# Patient Record
Sex: Male | Born: 1965 | Race: White | Hispanic: No | Marital: Married | Smoking: Never smoker
Health system: Southern US, Community
[De-identification: ages and names within clinical notes are randomized; demographics above are authoritative.]

## PROBLEM LIST (undated history)

## (undated) HISTORY — PX: WISDOM TOOTH EXTRACTION: SHX21

---

## 2010-01-05 ENCOUNTER — Encounter: Admission: RE | Admit: 2010-01-05 | Discharge: 2010-01-05 | Payer: Self-pay | Admitting: Family Medicine

## 2014-07-10 ENCOUNTER — Emergency Department (HOSPITAL_COMMUNITY): Payer: 59

## 2014-07-10 ENCOUNTER — Encounter (HOSPITAL_COMMUNITY): Payer: Self-pay

## 2014-07-10 ENCOUNTER — Emergency Department (HOSPITAL_COMMUNITY)
Admission: EM | Admit: 2014-07-10 | Discharge: 2014-07-10 | Disposition: A | Discharge status: Home / Self Care | Payer: 59 | Attending: Emergency Medicine | Admitting: Emergency Medicine

## 2014-07-10 DIAGNOSIS — R42 Dizziness and giddiness: Secondary | ICD-10-CM

## 2014-07-10 DIAGNOSIS — R197 Diarrhea, unspecified: Secondary | ICD-10-CM | POA: Insufficient documentation

## 2014-07-10 DIAGNOSIS — R112 Nausea with vomiting, unspecified: Secondary | ICD-10-CM

## 2014-07-10 DIAGNOSIS — R630 Anorexia: Secondary | ICD-10-CM | POA: Diagnosis not present

## 2014-07-10 LAB — COMPREHENSIVE METABOLIC PANEL
ALBUMIN: 4.2 g/dL (ref 3.5–5.2)
ALK PHOS: 76 U/L (ref 39–117)
ALT: 35 U/L (ref 0–53)
ANION GAP: 9 (ref 5–15)
AST: 26 U/L (ref 0–37)
BUN: 9 mg/dL (ref 6–23)
CALCIUM: 9.5 mg/dL (ref 8.4–10.5)
CHLORIDE: 106 mmol/L (ref 96–112)
CO2: 25 mmol/L (ref 19–32)
CREATININE: 0.6 mg/dL (ref 0.50–1.35)
GFR calc non Af Amer: >90 mL/min (ref >90)
Glucose, Bld: 130 mg/dL — ABNORMAL HIGH (ref 70–99)
Potassium: 3.8 mmol/L (ref 3.5–5.1)
Sodium: 140 mmol/L (ref 135–145)
TOTAL PROTEIN: 7.2 g/dL (ref 6.0–8.3)
Total Bilirubin: 0.7 mg/dL (ref 0.3–1.2)

## 2014-07-10 LAB — CBC WITH DIFFERENTIAL/PLATELET
Basophils Absolute: 0.1 10*3/uL (ref 0.0–0.1)
Basophils Relative: 1 % (ref 0–1)
EOS ABS: 0.1 10*3/uL (ref 0.0–0.7)
EOS PCT: 1 % (ref 0–5)
HEMATOCRIT: 46.2 % (ref 39.0–52.0)
HEMOGLOBIN: 16.3 g/dL (ref 13.0–17.0)
LYMPHS ABS: 1.9 10*3/uL (ref 0.7–4.0)
LYMPHS PCT: 24 % (ref 12–46)
MCH: 31.7 pg (ref 26.0–34.0)
MCHC: 35.3 g/dL (ref 30.0–36.0)
MCV: 89.7 fL (ref 78.0–100.0)
MONOS PCT: 6 % (ref 3–12)
Monocytes Absolute: 0.5 10*3/uL (ref 0.1–1.0)
Neutro Abs: 5.5 10*3/uL (ref 1.7–7.7)
Neutrophils Relative %: 68 % (ref 43–77)
PLATELETS: 213 10*3/uL (ref 150–400)
RBC: 5.15 MIL/uL (ref 4.22–5.81)
RDW: 12.5 % (ref 11.5–15.5)
WBC: 8 10*3/uL (ref 4.0–10.5)

## 2014-07-10 LAB — URINALYSIS, ROUTINE W REFLEX MICROSCOPIC
BILIRUBIN URINE: NEGATIVE
Glucose, UA: NEGATIVE mg/dL
Hgb urine dipstick: NEGATIVE
KETONES UR: NEGATIVE mg/dL
LEUKOCYTES UA: NEGATIVE
NITRITE: NEGATIVE
PH: 8.5 — AB (ref 5.0–8.0)
PROTEIN: 100 mg/dL — AB
Specific Gravity, Urine: 1.021 (ref 1.005–1.030)
Urobilinogen, UA: 0.2 mg/dL (ref 0.0–1.0)

## 2014-07-10 LAB — URINE MICROSCOPIC-ADD ON

## 2014-07-10 LAB — TROPONIN I
Troponin I: <0.03 ng/mL (ref <0.031)
Troponin I: <0.03 ng/mL (ref <0.031)

## 2014-07-10 LAB — I-STAT CG4 LACTIC ACID, ED
LACTIC ACID, VENOUS: 1.96 mmol/L (ref 0.5–2.0)
Lactic Acid, Venous: 1.07 mmol/L (ref 0.5–2.0)

## 2014-07-10 LAB — LIPASE, BLOOD: Lipase: 22 U/L (ref 11–59)

## 2014-07-10 MED ORDER — ONDANSETRON HCL 4 MG/2ML IJ SOLN
4.0000 mg | Freq: Once | INTRAMUSCULAR | Status: AC
  Administered 2014-07-10: 4 mg via INTRAVENOUS
  Filled 2014-07-10: qty 2

## 2014-07-10 MED ORDER — SODIUM CHLORIDE 0.9 % IV BOLUS (SEPSIS)
1000.0000 mL | Freq: Once | INTRAVENOUS | Status: AC
  Administered 2014-07-10: 1000 mL via INTRAVENOUS

## 2014-07-10 NOTE — ED Notes (Signed)
Pt states he didn't have an appetite at dinner last night.  Pt was at his baseline when he went to bed at 2200.  Pt woke at 0700 with dizziness, blurred vision, N/V/D.

## 2014-07-10 NOTE — ED Provider Notes (Signed)
CSN: [id number]     Arrival date & time 07/10/14  1213 History  This chart was scribed for Glynn Octave, MD by Leone Payor, ED Scribe. This patient was seen in room B18C/B18C and the patient's care was started 1:41 PM.    Chief Complaint  Patient presents with  . Nausea  . Dizziness  . Emesis  . Diarrhea  . Blurred Vision   The history is provided by the patient. No language interpreter was used.    HPI Comments: Evan Pearson is a 49 y.o. male who presents to the Emergency Department complaining of room spinning dizziness and lightheadedness that began this morning after waking. Patient states he feels off balance when attempting to stand and walk. He states the symptoms are worse with movement. He denies similar symptoms in the past. He complains of nausea and vomiting (x5-6 episodes today) with a few episodes of diarrhea. He states he had a loss of appetite last night halfway through dinner last night. He denies recent antibiotic use or recent foreign travel. He denies history of abdominal surgeries. He denies fever, HA, blurry vision, hematochezia, hematemesis, abdominal pain. Patient states he drinks 5-6 beers daily, but states not drinking doesn't elicit tremors.   History reviewed. No pertinent past medical history. History reviewed. No pertinent past surgical history. No family history on file. History  Substance Use Topics  . Smoking status: Never Smoker   . Smokeless tobacco: Not on file  . Alcohol Use: Yes     Comment: 5 beers daily    Review of Systems  A complete 10 system review of systems was obtained and all systems are negative except as noted in the HPI and PMH.    Allergies  Review of patient's allergies indicates no known allergies.  Home Medications   Prior to Admission medications   Not on File   BP 158/86 mmHg  Pulse 78  Temp(Src) 97.5 F (36.4 C) (Oral)  Resp 12  Ht 6' (1.829 m)  Wt 195 lb (88.451 kg)  BMI 26.44 kg/m2  SpO2 97% Physical Exam   Constitutional: He is oriented to person, place, and time. He appears well-developed and well-nourished. No distress.  Pale appearing   HENT:  Head: Normocephalic and atraumatic.  Mouth/Throat: Oropharynx is clear and moist. Mucous membranes are dry. No oropharyngeal exudate.  Eyes: Conjunctivae and EOM are normal. Pupils are equal, round, and reactive to light.  Neck: Normal range of motion. Neck supple.  No meningismus.  Cardiovascular: Normal rate, regular rhythm, normal heart sounds and intact distal pulses.   No murmur heard. Pulmonary/Chest: Effort normal and breath sounds normal. No respiratory distress.  Abdominal: Soft. There is no tenderness. There is no rebound and no guarding.  Genitourinary:  No CVA tenderness  Musculoskeletal: Normal range of motion. He exhibits no edema or tenderness.  Neurological: He is alert and oriented to person, place, and time. No cranial nerve deficit. He exhibits normal muscle tone. Coordination normal.  No ataxia on finger to nose bilaterally. No pronator drift. 5/5 strength throughout. CN 2-12 intact. Negative Romberg. Equal grip strength. Sensation intact. Gait is normal.   Skin: Skin is warm. No rash noted. No pallor.  Psychiatric: He has a normal mood and affect. His behavior is normal.  Nursing note and vitals reviewed.   ED Course  Procedures (including critical care time)  DIAGNOSTIC STUDIES: Oxygen Saturation is 97% on RA, adequate by my interpretation.    COORDINATION OF CARE: 1:48 PM Discussed treatment plan with  pt at bedside and pt agreed to plan.   Labs Review Labs Reviewed  COMPREHENSIVE METABOLIC PANEL - Abnormal; Notable for the following:    Glucose, Bld 130 (*)    All other components within normal limits  URINALYSIS, ROUTINE W REFLEX MICROSCOPIC - Abnormal; Notable for the following:    APPearance CLOUDY (*)    pH 8.5 (*)    Protein, ur 100 (*)    All other components within normal limits  CBC WITH  DIFFERENTIAL/PLATELET  LIPASE, BLOOD  TROPONIN I  URINE MICROSCOPIC-ADD ON  TROPONIN I  I-STAT CG4 LACTIC ACID, ED  I-STAT CG4 LACTIC ACID, ED    Imaging Review Ct Head Wo Contrast  07/10/2014   CLINICAL DATA:  Dizziness and unsteady gait beginning today. No pain or injury.  EXAM: CT HEAD WITHOUT CONTRAST  TECHNIQUE: Contiguous axial images were obtained from the base of the skull through the vertex without intravenous contrast.  COMPARISON:  None.  FINDINGS: There is no intra or extra-axial fluid collection or mass lesion. The basilar cisterns and ventricles have a normal appearance. There is no CT evidence for acute infarction or hemorrhage.  Bone windows show significant sinus disease involving multiple ethmoid air cells. No calvarial fracture.  IMPRESSION: 1.  No evidence for acute intracranial abnormality. 2. Significant sinus disease.   Electronically Signed   By: Norva PavlovElizabeth  Brown M.D.   On: 07/10/2014 16:47     EKG Interpretation   Date/Time:  Friday July 10 2014 14:44:15 EST Ventricular Rate:  71 PR Interval:  142 QRS Duration: 101 QT Interval:  371 QTC Calculation: 403 R Axis:   13 Text Interpretation:  Sinus rhythm RSR' in V1 or V2, probably normal  variant Borderline repolarization abnormality Minimal ST elevation,  anterior leads No previous ECGs available Confirmed by Manus GunningANCOUR  MD,  Donnesha Karg [phone]([id number]) on 07/10/2014 3:04:04 PM     U  MDM   Final diagnoses:  Nausea vomiting and diarrhea  Dizziness   Poor appetite since last night awoke with room spinning dizziness, difficulty walking, nausea, vomiting and diarrhea since last night. Denies abdominal pain or chest pain.  Nonfocal neurological exam. Negative Romberg. Normal gait.  EKG with anterior ST elevation discussed with Dr. Eldridge DaceVaranasi. He feels this does not represent STEMI especially in setting of no chest pain.  Troponin is negative. Labs are reassuring. Patient is tolerating by mouth in the ED. Denies any  chest pain or abdominal pain. He continues to endorse some dizziness with standing but the orthostatics are negative. He is able to ambulate. He'll continue to have by mouth and IV hydration. Second troponin will be obtained given abnormal EKG. CT head will be obtained as well given ongoing dizziness. Care will be transferred to Dr. Radford PaxBeaton at shift change.  I personally performed the services described in this documentation, which was scribed in my presence. The recorded information has been reviewed and is accurate.   Glynn OctaveStephen Verdelle Valtierra, MD 07/10/14 1745

## 2014-07-10 NOTE — Discharge Instructions (Signed)
Diarrhea Diarrhea is watery poop (stool). It can make you feel weak, tired, thirsty, or give you a dry mouth (signs of dehydration). Watery poop is a sign of another problem, most often an infection. It often lasts 2-3 days. It can last longer if it is a sign of something serious. Take care of yourself as told by your doctor. HOME CARE   Drink 1 cup (8 ounces) of fluid each time you have watery poop.  Do not drink the following fluids:  Those that contain simple sugars (fructose, glucose, galactose, lactose, sucrose, maltose).  Sports drinks.  Fruit juices.  Whole milk products.  Sodas.  Drinks with caffeine (coffee, tea, soda) or alcohol.  Oral rehydration solution may be used if the doctor says it is okay. You may make your own solution. Follow this recipe:   - teaspoon table salt.   teaspoon baking soda.   teaspoon salt substitute containing potassium chloride.  1 tablespoons sugar.  1 liter (34 ounces) of water.  Avoid the following foods:  High fiber foods, such as raw fruits and vegetables.  Nuts, seeds, and whole grain breads and cereals.   Those that are sweetened with sugar alcohols (xylitol, sorbitol, mannitol).  Try eating the following foods:  Starchy foods, such as rice, toast, pasta, low-sugar cereal, oatmeal, baked potatoes, crackers, and bagels.  Bananas.  Applesauce.  Eat probiotic-rich foods, such as yogurt and milk products that are fermented.  Wash your hands well after each time you have watery poop.  Only take medicine as told by your doctor.  Take a warm bath to help lessen burning or pain from having watery poop. GET HELP RIGHT AWAY IF:   You cannot drink fluids without throwing up (vomiting).  You keep throwing up.  You have blood in your poop, or your poop looks black and tarry.  You do not pee (urinate) in 6-8 hours, or there is only a small amount of very dark pee.  You have belly (abdominal) pain that gets worse or stays  in the same spot (localizes).  You are weak, dizzy, confused, or light-headed.  You have a very bad headache.  Your watery poop gets worse or does not get better.  You have a fever or lasting symptoms for more than 2-3 days.  You have a fever and your symptoms suddenly get worse. MAKE SURE YOU:   Understand these instructions.  Will watch your condition.  Will get help right away if you are not doing well or get worse. Document Released: 11/01/2007 Document Revised: 09/29/2013 Document Reviewed: 01/21/2012 [facility] Patient Information 2015 [place]. This information is not intended to replace advice given to you by your health care provider. Make sure you discuss any questions you have with your health care provider.  Dizziness  Dizziness means you feel unsteady or lightheaded. You might feel like you are going to pass out (faint). HOME CARE   Drink enough fluids to keep your pee (urine) clear or pale yellow.  Take your medicines exactly as told by your doctor. If you take blood pressure medicine, always stand up slowly from the lying or sitting position. Hold on to something to steady yourself.  If you need to stand in one place for a long time, move your legs often. Tighten and relax your leg muscles.  Have someone stay with you until you feel okay.  Do not drive or use heavy machinery if you feel dizzy.  Do not drink alcohol. GET HELP RIGHT AWAY IF:  You feel dizzy or lightheaded and it gets worse.  You feel sick to your stomach (nauseous), or you throw up (vomit).  You have trouble talking or walking.  You feel weak or have trouble using your arms, hands, or legs.  You cannot think clearly or have trouble forming sentences.  You have chest pain, belly (abdominal) pain, sweating, or you are short of breath.  Your vision changes.  You are bleeding.  You have problems from your medicine that seem to be getting worse. MAKE SURE YOU:   Understand these  instructions.  Will watch your condition.  Will get help right away if you are not doing well or get worse. Document Released: 05/04/2011 Document Revised: 08/07/2011 Document Reviewed: 05/04/2011 [facility] Patient Information 2015 [place], [place]. This information is not intended to replace advice given to you by your health care provider. Make sure you discuss any questions you have with your health care provider.

## 2014-07-10 NOTE — ED Notes (Signed)
Ambulated in the hallway 50 feet. Did very well

## 2014-07-14 ENCOUNTER — Telehealth (HOSPITAL_BASED_OUTPATIENT_CLINIC_OR_DEPARTMENT_OTHER): Payer: Self-pay | Admitting: Emergency Medicine

## 2015-09-30 DIAGNOSIS — H5213 Myopia, bilateral: Secondary | ICD-10-CM | POA: Diagnosis not present

## 2015-10-20 DIAGNOSIS — L237 Allergic contact dermatitis due to plants, except food: Secondary | ICD-10-CM | POA: Diagnosis not present

## 2016-03-20 DIAGNOSIS — Z0000 Encounter for general adult medical examination without abnormal findings: Secondary | ICD-10-CM | POA: Diagnosis not present

## 2016-03-20 DIAGNOSIS — Z23 Encounter for immunization: Secondary | ICD-10-CM | POA: Diagnosis not present

## 2016-03-20 DIAGNOSIS — E781 Pure hyperglyceridemia: Secondary | ICD-10-CM | POA: Diagnosis not present

## 2016-03-21 DIAGNOSIS — S82892A Other fracture of left lower leg, initial encounter for closed fracture: Secondary | ICD-10-CM | POA: Diagnosis not present

## 2016-03-22 DIAGNOSIS — M25572 Pain in left ankle and joints of left foot: Secondary | ICD-10-CM | POA: Diagnosis not present

## 2016-03-22 DIAGNOSIS — S82842A Displaced bimalleolar fracture of left lower leg, initial encounter for closed fracture: Secondary | ICD-10-CM | POA: Diagnosis not present

## 2016-03-29 DIAGNOSIS — S82842D Displaced bimalleolar fracture of left lower leg, subsequent encounter for closed fracture with routine healing: Secondary | ICD-10-CM | POA: Diagnosis not present

## 2016-04-05 DIAGNOSIS — S82842D Displaced bimalleolar fracture of left lower leg, subsequent encounter for closed fracture with routine healing: Secondary | ICD-10-CM | POA: Diagnosis not present

## 2016-04-19 DIAGNOSIS — M25571 Pain in right ankle and joints of right foot: Secondary | ICD-10-CM | POA: Diagnosis not present

## 2016-04-19 DIAGNOSIS — S82842D Displaced bimalleolar fracture of left lower leg, subsequent encounter for closed fracture with routine healing: Secondary | ICD-10-CM | POA: Diagnosis not present

## 2016-05-23 DIAGNOSIS — M25572 Pain in left ankle and joints of left foot: Secondary | ICD-10-CM | POA: Diagnosis not present

## 2016-06-20 DIAGNOSIS — M25572 Pain in left ankle and joints of left foot: Secondary | ICD-10-CM | POA: Diagnosis not present

## 2016-06-22 DIAGNOSIS — S82842D Displaced bimalleolar fracture of left lower leg, subsequent encounter for closed fracture with routine healing: Secondary | ICD-10-CM | POA: Diagnosis not present

## 2016-06-22 DIAGNOSIS — M25572 Pain in left ankle and joints of left foot: Secondary | ICD-10-CM | POA: Diagnosis not present

## 2016-06-29 DIAGNOSIS — S82842D Displaced bimalleolar fracture of left lower leg, subsequent encounter for closed fracture with routine healing: Secondary | ICD-10-CM | POA: Diagnosis not present

## 2016-06-29 DIAGNOSIS — M25572 Pain in left ankle and joints of left foot: Secondary | ICD-10-CM | POA: Diagnosis not present

## 2016-07-06 DIAGNOSIS — M25572 Pain in left ankle and joints of left foot: Secondary | ICD-10-CM | POA: Diagnosis not present

## 2016-07-06 DIAGNOSIS — S82842D Displaced bimalleolar fracture of left lower leg, subsequent encounter for closed fracture with routine healing: Secondary | ICD-10-CM | POA: Diagnosis not present

## 2016-07-13 DIAGNOSIS — S82842D Displaced bimalleolar fracture of left lower leg, subsequent encounter for closed fracture with routine healing: Secondary | ICD-10-CM | POA: Diagnosis not present

## 2016-07-13 DIAGNOSIS — M25572 Pain in left ankle and joints of left foot: Secondary | ICD-10-CM | POA: Diagnosis not present

## 2016-07-18 DIAGNOSIS — M25572 Pain in left ankle and joints of left foot: Secondary | ICD-10-CM | POA: Diagnosis not present

## 2016-07-25 DIAGNOSIS — S82846K Nondisplaced bimalleolar fracture of unspecified lower leg, subsequent encounter for closed fracture with nonunion: Secondary | ICD-10-CM | POA: Diagnosis not present

## 2016-08-24 DIAGNOSIS — M25572 Pain in left ankle and joints of left foot: Secondary | ICD-10-CM | POA: Diagnosis not present

## 2016-09-21 DIAGNOSIS — S82842K Displaced bimalleolar fracture of left lower leg, subsequent encounter for closed fracture with nonunion: Secondary | ICD-10-CM | POA: Diagnosis not present

## 2016-09-26 ENCOUNTER — Other Ambulatory Visit: Payer: Self-pay | Admitting: Family Medicine

## 2016-09-26 DIAGNOSIS — M25572 Pain in left ankle and joints of left foot: Secondary | ICD-10-CM

## 2016-09-26 DIAGNOSIS — S82842K Displaced bimalleolar fracture of left lower leg, subsequent encounter for closed fracture with nonunion: Secondary | ICD-10-CM

## 2016-10-02 ENCOUNTER — Ambulatory Visit
Admission: RE | Admit: 2016-10-02 | Discharge: 2016-10-02 | Disposition: A | Discharge status: Home / Self Care | Payer: BLUE CROSS/BLUE SHIELD | Source: Ambulatory Visit | Attending: Family Medicine | Admitting: Family Medicine

## 2016-10-02 DIAGNOSIS — M25572 Pain in left ankle and joints of left foot: Secondary | ICD-10-CM

## 2016-10-02 DIAGNOSIS — S82842K Displaced bimalleolar fracture of left lower leg, subsequent encounter for closed fracture with nonunion: Secondary | ICD-10-CM

## 2016-10-02 DIAGNOSIS — S82433A Displaced oblique fracture of shaft of unspecified fibula, initial encounter for closed fracture: Secondary | ICD-10-CM | POA: Diagnosis not present

## 2016-10-05 DIAGNOSIS — S82842K Displaced bimalleolar fracture of left lower leg, subsequent encounter for closed fracture with nonunion: Secondary | ICD-10-CM | POA: Diagnosis not present

## 2016-10-11 ENCOUNTER — Encounter (HOSPITAL_BASED_OUTPATIENT_CLINIC_OR_DEPARTMENT_OTHER): Payer: Self-pay | Admitting: *Deleted

## 2016-10-17 NOTE — H&P (Signed)
Evan Pearson is an 51 y.o. male.    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  He is presently being treated with a bone stimulator.  Recent CT scan shows minimal bridging bone but there is some bridging bone.  He wears an Aircast for stabilization.  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  He reports that his discomfort is mild and he walks without assistive devices.  Dr. Althea CharonMcKinley has made him aware of the possibility of bone grafting and application of a plate, but at this time he is not quite ready for surgical intervention.  History reviewed. No pertinent past medical history.  Past Surgical History:  Procedure Laterality Date  . WISDOM TOOTH EXTRACTION      History reviewed. No pertinent family history. Social History:  reports that he has never smoked. He has never used smokeless tobacco. He reports that he drinks alcohol. He reports that he uses drugs, including Marijuana.  Allergies: No Known Allergies  No prescriptions prior to admission.    No results found for this or any previous visit (from the past 48 hour(s)). No results found.  Review of Systems  All other systems reviewed and are negative.   Height 6' (1.829 m), weight 88.5 kg (195 lb). Physical Exam  Constitutional: He is oriented to person, place, and time. He appears well-developed and well-nourished.  HENT:  Head: Normocephalic and atraumatic.  Eyes: Pupils are equal, round, and reactive to light.  Neck: Normal range of motion. Neck supple.  Cardiovascular: Intact distal pulses.   Respiratory: Effort normal.  Musculoskeletal: He exhibits tenderness.  Minimal swelling over the left lateral ankle.  Minimal tenderness over the distal fibula and no palpable motion at the fracture site.  His plain radiographs and CT scan are reviewed with  the patient and there is some bridging bone on the axial, sagittal and coronal views, probably about 5-10% between the 2 major fragments.  Neurological: He is alert and oriented to person, place, and time.  Skin: Skin is warm and dry.  Psychiatric: He has a normal mood and affect. His behavior is normal. Judgment and thought content normal.     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  Plan: We discussed the surgery, which would involve harvesting bone graft from his distal tibia applying it to the delayed union site and then application of a plate over the top of this.  At this time he would like to defer on surgery I am happy to see him back if he changes his mind.  We also discussed the risks and benefits of the surgery, specifically infection, blood clots and anesthesia problems.  Evan Pearson R, PA-C 10/17/2016, 7:58 AM

## 2016-10-18 ENCOUNTER — Ambulatory Visit (HOSPITAL_BASED_OUTPATIENT_CLINIC_OR_DEPARTMENT_OTHER): Payer: BLUE CROSS/BLUE SHIELD | Admitting: Anesthesiology

## 2016-10-18 ENCOUNTER — Encounter (HOSPITAL_BASED_OUTPATIENT_CLINIC_OR_DEPARTMENT_OTHER)
Admission: RE | Disposition: A | Discharge status: Home / Self Care | Payer: Self-pay | Source: Ambulatory Visit | Attending: Orthopedic Surgery

## 2016-10-18 ENCOUNTER — Encounter (HOSPITAL_BASED_OUTPATIENT_CLINIC_OR_DEPARTMENT_OTHER): Payer: Self-pay

## 2016-10-18 ENCOUNTER — Ambulatory Visit (HOSPITAL_BASED_OUTPATIENT_CLINIC_OR_DEPARTMENT_OTHER)
Admission: RE | Admit: 2016-10-18 | Discharge: 2016-10-18 | Disposition: A | Discharge status: Home / Self Care | Payer: BLUE CROSS/BLUE SHIELD | Source: Ambulatory Visit | Attending: Orthopedic Surgery | Admitting: Orthopedic Surgery

## 2016-10-18 DIAGNOSIS — X58XXXD Exposure to other specified factors, subsequent encounter: Secondary | ICD-10-CM | POA: Insufficient documentation

## 2016-10-18 DIAGNOSIS — G8918 Other acute postprocedural pain: Secondary | ICD-10-CM | POA: Diagnosis not present

## 2016-10-18 DIAGNOSIS — S8262XA Displaced fracture of lateral malleolus of left fibula, initial encounter for closed fracture: Secondary | ICD-10-CM | POA: Diagnosis present

## 2016-10-18 DIAGNOSIS — S8262XK Displaced fracture of lateral malleolus of left fibula, subsequent encounter for closed fracture with nonunion: Secondary | ICD-10-CM | POA: Diagnosis not present

## 2016-10-18 HISTORY — PX: ORIF ANKLE FRACTURE: SHX5408

## 2016-10-18 SURGERY — OPEN REDUCTION INTERNAL FIXATION (ORIF) ANKLE FRACTURE
Anesthesia: Regional | Site: Ankle | Laterality: Left

## 2016-10-18 MED ORDER — MIDAZOLAM HCL 2 MG/2ML IJ SOLN
INTRAMUSCULAR | Status: AC
  Filled 2016-10-18: qty 2

## 2016-10-18 MED ORDER — FENTANYL CITRATE (PF) 100 MCG/2ML IJ SOLN
50.0000 ug | INTRAMUSCULAR | Status: DC | PRN
  Administered 2016-10-18: 100 ug via INTRAVENOUS
  Administered 2016-10-18: 50 ug via INTRAVENOUS

## 2016-10-18 MED ORDER — LIDOCAINE 2% (20 MG/ML) 5 ML SYRINGE
INTRAMUSCULAR | Status: DC | PRN
  Administered 2016-10-18: 100 mg via INTRAVENOUS

## 2016-10-18 MED ORDER — OXYCODONE HCL 5 MG PO TABS
5.0000 mg | ORAL_TABLET | Freq: Once | ORAL | Status: DC | PRN
Start: 2016-10-18 — End: 2016-10-18

## 2016-10-18 MED ORDER — PROMETHAZINE HCL 25 MG/ML IJ SOLN: 6.2500 mg | INTRAMUSCULAR | Status: DC | PRN

## 2016-10-18 MED ORDER — ONDANSETRON HCL 4 MG/2ML IJ SOLN: 4.0000 mg | Freq: Four times a day (QID) | INTRAMUSCULAR | Status: DC | PRN

## 2016-10-18 MED ORDER — METOCLOPRAMIDE HCL 5 MG PO TABS
5.0000 mg | ORAL_TABLET | Freq: Three times a day (TID) | ORAL | Status: DC | PRN
Start: 2016-10-18 — End: 2016-10-18

## 2016-10-18 MED ORDER — FENTANYL CITRATE (PF) 100 MCG/2ML IJ SOLN
INTRAMUSCULAR | Status: AC
  Filled 2016-10-18: qty 2

## 2016-10-18 MED ORDER — SCOPOLAMINE 1 MG/3DAYS TD PT72: 1.0000 | MEDICATED_PATCH | Freq: Once | TRANSDERMAL | Status: DC | PRN

## 2016-10-18 MED ORDER — HYDROCODONE-ACETAMINOPHEN 5-325 MG PO TABS: 1.0000 | ORAL_TABLET | ORAL | 0 refills | Status: AC | PRN

## 2016-10-18 MED ORDER — HYDROMORPHONE HCL 1 MG/ML IJ SOLN: 0.2500 mg | INTRAMUSCULAR | Status: DC | PRN

## 2016-10-18 MED ORDER — ONDANSETRON HCL 4 MG PO TABS
4.0000 mg | ORAL_TABLET | Freq: Four times a day (QID) | ORAL | Status: DC | PRN
Start: 2016-10-18 — End: 2016-10-18

## 2016-10-18 MED ORDER — CHLORHEXIDINE GLUCONATE 4 % EX LIQD: 60.0000 mL | Freq: Once | CUTANEOUS | Status: DC

## 2016-10-18 MED ORDER — LACTATED RINGERS IV SOLN
INTRAVENOUS | Status: DC
  Administered 2016-10-18 (×3): via INTRAVENOUS

## 2016-10-18 MED ORDER — ONDANSETRON HCL 4 MG/2ML IJ SOLN
INTRAMUSCULAR | Status: AC
  Filled 2016-10-18: qty 2

## 2016-10-18 MED ORDER — PROPOFOL 10 MG/ML IV BOLUS
INTRAVENOUS | Status: DC | PRN
  Administered 2016-10-18: 200 mg via INTRAVENOUS

## 2016-10-18 MED ORDER — DEXAMETHASONE SODIUM PHOSPHATE 10 MG/ML IJ SOLN
INTRAMUSCULAR | Status: DC | PRN
  Administered 2016-10-18: 10 mg via INTRAVENOUS

## 2016-10-18 MED ORDER — CEFAZOLIN SODIUM-DEXTROSE 2-4 GM/100ML-% IV SOLN
2.0000 g | INTRAVENOUS | Status: AC
  Administered 2016-10-18: 2 g via INTRAVENOUS

## 2016-10-18 MED ORDER — ROPIVACAINE HCL 5 MG/ML IJ SOLN
INTRAMUSCULAR | Status: DC | PRN
  Administered 2016-10-18: 30 mL via PERINEURAL
  Administered 2016-10-18: 20 mL via PERINEURAL

## 2016-10-18 MED ORDER — LIDOCAINE 2% (20 MG/ML) 5 ML SYRINGE
INTRAMUSCULAR | Status: AC
  Filled 2016-10-18: qty 5

## 2016-10-18 MED ORDER — ONDANSETRON HCL 4 MG/2ML IJ SOLN
INTRAMUSCULAR | Status: DC | PRN
  Administered 2016-10-18: 4 mg via INTRAVENOUS

## 2016-10-18 MED ORDER — OXYCODONE HCL 5 MG/5ML PO SOLN: 5.0000 mg | Freq: Once | ORAL | Status: DC | PRN

## 2016-10-18 MED ORDER — DEXAMETHASONE SODIUM PHOSPHATE 10 MG/ML IJ SOLN
INTRAMUSCULAR | Status: AC
  Filled 2016-10-18: qty 1

## 2016-10-18 MED ORDER — METOCLOPRAMIDE HCL 5 MG/ML IJ SOLN: 5.0000 mg | Freq: Three times a day (TID) | INTRAMUSCULAR | Status: DC | PRN

## 2016-10-18 MED ORDER — MIDAZOLAM HCL 2 MG/2ML IJ SOLN
1.0000 mg | INTRAMUSCULAR | Status: DC | PRN
  Administered 2016-10-18: 2 mg via INTRAVENOUS
  Administered 2016-10-18 (×2): 1 mg via INTRAVENOUS

## 2016-10-18 MED ORDER — 0.9 % SODIUM CHLORIDE (POUR BTL) OPTIME
TOPICAL | Status: DC | PRN
  Administered 2016-10-18: 500 mL

## 2016-10-18 MED ORDER — MEPERIDINE HCL 25 MG/ML IJ SOLN: 6.2500 mg | INTRAMUSCULAR | Status: DC | PRN

## 2016-10-18 MED ORDER — DEXTROSE-NACL 5-0.45 % IV SOLN: INTRAVENOUS | Status: DC

## 2016-10-18 MED ORDER — CEFAZOLIN SODIUM-DEXTROSE 2-4 GM/100ML-% IV SOLN
INTRAVENOUS | Status: AC
  Filled 2016-10-18: qty 100

## 2016-10-18 MED ORDER — PROPOFOL 500 MG/50ML IV EMUL
INTRAVENOUS | Status: AC
  Filled 2016-10-18: qty 50

## 2016-10-18 SURGICAL SUPPLY — 78 items
BANDAGE ACE 3X5.8 VEL STRL LF (GAUZE/BANDAGES/DRESSINGS) IMPLANT
BANDAGE ACE 4X5 VEL STRL LF (GAUZE/BANDAGES/DRESSINGS) ×2 IMPLANT
BANDAGE ACE 6X5 VEL STRL LF (GAUZE/BANDAGES/DRESSINGS) ×2 IMPLANT
BANDAGE ESMARK 6X9 LF (GAUZE/BANDAGES/DRESSINGS) ×2 IMPLANT
BIT DRILL 2.5X2.75 QC CALB (BIT) ×2 IMPLANT
BLADE SURG 10 STRL SS (BLADE) ×2 IMPLANT
BLADE SURG 15 STRL LF DISP TIS (BLADE) ×2 IMPLANT
BLADE SURG 15 STRL SS (BLADE) ×2
BNDG ESMARK 6X9 LF (GAUZE/BANDAGES/DRESSINGS) ×4
BOOT STEPPER DURA LG (SOFTGOODS) IMPLANT
BOOT STEPPER DURA MED (SOFTGOODS) IMPLANT
BOOT STEPPER DURA SM (SOFTGOODS) IMPLANT
CANISTER SUCT 1200ML W/VALVE (MISCELLANEOUS) ×2 IMPLANT
COVER BACK TABLE 60X90IN (DRAPES) ×2 IMPLANT
CUFF TOURNIQUET SINGLE 24IN (TOURNIQUET CUFF) IMPLANT
CUFF TOURNIQUET SINGLE 34IN LL (TOURNIQUET CUFF) ×2 IMPLANT
DECANTER SPIKE VIAL GLASS SM (MISCELLANEOUS) IMPLANT
DRAPE EXTREMITY T 121X128X90 (DRAPE) ×2 IMPLANT
DRAPE IMP U-DRAPE 54X76 (DRAPES) ×2 IMPLANT
DRAPE OEC MINIVIEW 54X84 (DRAPES) ×2 IMPLANT
DRAPE SURG 17X23 STRL (DRAPES) IMPLANT
DRAPE U-SHAPE 47X51 STRL (DRAPES) IMPLANT
DURAPREP 26ML APPLICATOR (WOUND CARE) ×2 IMPLANT
ELECT REM PT RETURN 9FT ADLT (ELECTROSURGICAL) ×2
ELECTRODE REM PT RTRN 9FT ADLT (ELECTROSURGICAL) ×1 IMPLANT
GAUZE SPONGE 4X4 12PLY STRL (GAUZE/BANDAGES/DRESSINGS) ×2 IMPLANT
GAUZE SPONGE 4X4 16PLY XRAY LF (GAUZE/BANDAGES/DRESSINGS) IMPLANT
GAUZE XEROFORM 1X8 LF (GAUZE/BANDAGES/DRESSINGS) ×2 IMPLANT
GLOVE BIO SURGEON STRL SZ7.5 (GLOVE) ×2 IMPLANT
GLOVE BIO SURGEON STRL SZ8.5 (GLOVE) ×2 IMPLANT
GLOVE BIOGEL PI IND STRL 8 (GLOVE) ×2 IMPLANT
GLOVE BIOGEL PI IND STRL 9 (GLOVE) ×1 IMPLANT
GLOVE BIOGEL PI INDICATOR 8 (GLOVE) ×2
GLOVE BIOGEL PI INDICATOR 9 (GLOVE) ×1
GOWN STRL REUS W/ TWL LRG LVL3 (GOWN DISPOSABLE) ×2 IMPLANT
GOWN STRL REUS W/TWL LRG LVL3 (GOWN DISPOSABLE) ×2
GOWN STRL REUS W/TWL XL LVL3 (GOWN DISPOSABLE) ×2 IMPLANT
NEEDLE HYPO 22GX1.5 SAFETY (NEEDLE) IMPLANT
NS IRRIG 1000ML POUR BTL (IV SOLUTION) ×2 IMPLANT
PACK BASIN DAY SURGERY FS (CUSTOM PROCEDURE TRAY) ×2 IMPLANT
PAD CAST 3X4 CTTN HI CHSV (CAST SUPPLIES) IMPLANT
PAD CAST 4YDX4 CTTN HI CHSV (CAST SUPPLIES) ×1 IMPLANT
PADDING CAST ABS 4INX4YD NS (CAST SUPPLIES) ×1
PADDING CAST ABS COTTON 4X4 ST (CAST SUPPLIES) ×1 IMPLANT
PADDING CAST COTTON 3X4 STRL (CAST SUPPLIES)
PADDING CAST COTTON 4X4 STRL (CAST SUPPLIES) ×1
PADDING CAST COTTON 6X4 STRL (CAST SUPPLIES) ×2 IMPLANT
PADDING UNDERCAST 2 STRL (CAST SUPPLIES)
PADDING UNDERCAST 2X4 STRL (CAST SUPPLIES) IMPLANT
PENCIL BUTTON HOLSTER BLD 10FT (ELECTRODE) ×2 IMPLANT
PLATE TUB 100DEG 5 HO (Plate) ×2 IMPLANT
SCREW CORTICAL 3.5MM  12MM (Screw) ×2 IMPLANT
SCREW CORTICAL 3.5MM 12MM (Screw) ×2 IMPLANT
SCREW CORTICAL 3.5MM 14MM (Screw) ×4 IMPLANT
SCREW NLOCK CANC HEX 4X14 (Screw) ×2 IMPLANT
SCREW NLOCK CANC HEX 4X16 (Screw) ×2 IMPLANT
SLEEVE SCD COMPRESS KNEE MED (MISCELLANEOUS) IMPLANT
SPLINT FAST PLASTER 5X30 (CAST SUPPLIES)
SPLINT PLASTER CAST FAST 5X30 (CAST SUPPLIES) IMPLANT
SPONGE LAP 18X18 X RAY DECT (DISPOSABLE) IMPLANT
SPONGE LAP 4X18 X RAY DECT (DISPOSABLE) IMPLANT
STAPLER VISISTAT (STAPLE) IMPLANT
STAPLER VISISTAT 35W (STAPLE) IMPLANT
SUCTION FRAZIER HANDLE 10FR (MISCELLANEOUS) ×1
SUCTION TUBE FRAZIER 10FR DISP (MISCELLANEOUS) ×1 IMPLANT
SUT ETHILON 3 0 PS 1 (SUTURE) IMPLANT
SUT ETHILON 4 0 PS 2 18 (SUTURE) IMPLANT
SUT TICRON 1 T 12 (SUTURE) IMPLANT
SUT VIC AB 2-0 SH 27 (SUTURE)
SUT VIC AB 2-0 SH 27XBRD (SUTURE) IMPLANT
SUT VIC AB 3-0 SH 27 (SUTURE) ×1
SUT VIC AB 3-0 SH 27X BRD (SUTURE) ×1 IMPLANT
SYR BULB 3OZ (MISCELLANEOUS) ×2 IMPLANT
SYR CONTROL 10ML LL (SYRINGE) IMPLANT
TOWEL OR 17X24 6PK STRL BLUE (TOWEL DISPOSABLE) ×2 IMPLANT
TUBE CONNECTING 20X1/4 (TUBING) ×2 IMPLANT
UNDERPAD 30X30 (UNDERPADS AND DIAPERS) ×2 IMPLANT
YANKAUER SUCT BULB TIP NO VENT (SUCTIONS) IMPLANT

## 2016-10-18 NOTE — Progress Notes (Signed)
Assisted Dr. Miller with left, ultrasound guided, popliteal, adductor canal block. Side rails up, monitors on throughout procedure. See vital signs in flow sheet. Tolerated Procedure well. 

## 2016-10-18 NOTE — Transfer of Care (Signed)
Immediate Anesthesia Transfer of Care Note  Patient: Evan Pearson  Procedure(s) Performed: Procedure(s): OPEN REDUCTION INTERNAL FIXATION (ORIF) ANKLE: LATERAL ANKLE NON UNION (Left)  Patient Location: PACU  Anesthesia Type:General  Level of Consciousness: awake, alert , oriented and patient cooperative  Airway & Oxygen Therapy: Patient Spontanous Breathing and Patient connected to face mask oxygen  Post-op Assessment: Report given to RN and Post -op Vital signs reviewed and stable  Post vital signs: Reviewed and stable  Last Vitals:  Vitals:   10/18/16 1130 10/18/16 1145  BP: (!) 137/94 (!) 142/86  Pulse: 70 71  Resp: 16 10  Temp:      Last Pain:  Vitals:   10/18/16 1031  TempSrc: Oral         Complications: No apparent anesthesia complications

## 2016-10-18 NOTE — Anesthesia Procedure Notes (Signed)
Anesthesia Regional Block: Adductor canal block   Pre-Anesthetic Checklist: ,, timeout performed, Correct Patient, Correct Site, Correct Laterality, Correct Procedure, Correct Position, site marked, Risks and benefits discussed,  Surgical consent,  Pre-op evaluation,  At surgeon's request and post-op pain management  Laterality: Left  Prep: Dura Prep       Needles:  Injection technique: Single-shot  Needle Type: Stimiplex     Needle Length: 9cm  Needle Gauge: 21     Additional Needles:   Procedures: ultrasound guided,,,,,,,,  Narrative:  Start time: 10/18/2016 11:13 AM End time: 10/18/2016 11:18 AM Injection made incrementally with aspirations every 5 mL.  Performed by: Personally  Anesthesiologist: Anitra LauthMILLER, Starsky Nanna RAY

## 2016-10-18 NOTE — Anesthesia Postprocedure Evaluation (Signed)
Anesthesia Post Note  Patient: Evan Pearson  Procedure(s) Performed: Procedure(s) (LRB): OPEN REDUCTION INTERNAL FIXATION (ORIF) ANKLE: LATERAL ANKLE NON UNION (Left)  Patient location during evaluation: PACU Anesthesia Type: Regional Level of consciousness: awake and alert Pain management: pain level controlled Vital Signs Assessment: post-procedure vital signs reviewed and stable Respiratory status: spontaneous breathing, nonlabored ventilation and respiratory function stable Cardiovascular status: blood pressure returned to baseline and stable Postop Assessment: no signs of nausea or vomiting Anesthetic complications: no       Last Vitals:  Vitals:   10/18/16 1345 10/18/16 1403  BP: (!) 137/93 130/82  Pulse: 71 65  Resp: 20 18  Temp:  36.8 C    Last Pain:  Vitals:   10/18/16 1031  TempSrc: Oral                 Lowella CurbWarren Ray Miller

## 2016-10-18 NOTE — Anesthesia Procedure Notes (Signed)
Performed by: Pearson GrippeOBERTSON, Sissi Padia M

## 2016-10-18 NOTE — Anesthesia Preprocedure Evaluation (Addendum)
Anesthesia Evaluation  Patient identified by MRN, date of birth, ID band Patient awake    Reviewed: Allergy & Precautions, NPO status , Patient's Chart, lab work & pertinent test results  Airway Mallampati: II  TM Distance: >3 FB Neck ROM: Full    Dental no notable dental hx.    Pulmonary neg pulmonary ROS,    Pulmonary exam normal breath sounds clear to auscultation       Cardiovascular negative cardio ROS Normal cardiovascular exam Rhythm:Regular Rate:Normal  Hyperlipidemia   Neuro/Psych negative neurological ROS  negative psych ROS   GI/Hepatic negative GI ROS, Neg liver ROS,   Endo/Other  negative endocrine ROS  Renal/GU negative Renal ROS  negative genitourinary   Musculoskeletal negative musculoskeletal ROS (+)   Abdominal   Peds negative pediatric ROS (+)  Hematology negative hematology ROS (+)   Anesthesia Other Findings   Reproductive/Obstetrics negative OB ROS                            Anesthesia Physical Anesthesia Plan  ASA: II  Anesthesia Plan: General and Regional   Post-op Pain Management: GA combined w/ Regional for post-op pain   Induction: Intravenous  Airway Management Planned: LMA  Additional Equipment:   Intra-op Plan:   Post-operative Plan: Extubation in OR  Informed Consent: I have reviewed the patients History and Physical, chart, labs and discussed the procedure including the risks, benefits and alternatives for the proposed anesthesia with the patient or authorized representative who has indicated his/her understanding and acceptance.   Dental advisory given  Plan Discussed with: CRNA  Anesthesia Plan Comments:         Anesthesia Quick Evaluation

## 2016-10-18 NOTE — Anesthesia Procedure Notes (Signed)
Anesthesia Regional Block: Popliteal block   Pre-Anesthetic Checklist: ,, timeout performed, Correct Patient, Correct Site, Correct Laterality, Correct Procedure, Correct Position, site marked, Risks and benefits discussed,  Surgical consent,  Pre-op evaluation,  At surgeon's request and post-op pain management  Laterality: Left  Prep: Dura Prep       Needles:  Injection technique: Single-shot  Needle Type: Stimiplex     Needle Length: 9cm  Needle Gauge: 21     Additional Needles:   Procedures: ultrasound guided,,,,,,,,  Narrative:  Start time: 10/18/2016 11:13 AM End time: 10/18/2016 11:18 AM Injection made incrementally with aspirations every 5 mL.  Performed by: Personally  Anesthesiologist: Anitra LauthMILLER, Darnesha Diloreto RAY

## 2016-10-18 NOTE — Discharge Instructions (Signed)

## 2016-10-18 NOTE — Anesthesia Procedure Notes (Signed)
Procedure Name: LMA Insertion Date/Time: 10/18/2016 12:18 PM Performed by: Pearson GrippeOBERTSON, Chong Wojdyla M Pre-anesthesia Checklist: Patient identified, Timeout performed, Emergency Drugs available, Suction available and Patient being monitored Patient Re-evaluated:Patient Re-evaluated prior to inductionOxygen Delivery Method: Circle system utilized Preoxygenation: Pre-oxygenation with 100% oxygen Intubation Type: IV induction Ventilation: Mask ventilation without difficulty LMA: LMA inserted LMA Size: 4.0 Number of attempts: 1 Placement Confirmation: positive ETCO2 and breath sounds checked- equal and bilateral Tube secured with: Tape

## 2016-10-18 NOTE — H&P (View-Only) (Signed)
Assisted Dr. Miller with left, ultrasound guided, popliteal, adductor canal block. Side rails up, monitors on throughout procedure. See vital signs in flow sheet. Tolerated Procedure well. 

## 2016-10-18 NOTE — Op Note (Addendum)
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 Turner Danielsowan M.D.  First assistant: Tomi LikensEric K. Gaylene BrooksPhillips PA-C  (present throughout entire procedure and necessary for timely completion of the procedure) Anesthetic: Gen. LMA  Estimated blood loss: Minimal  Tourniquet time: 30 minutes   Indications for procedure: Patient sustained a minimally displaced left ankle lateral malleolus fracture 8 months ago. Treated with a Cam Walker boot, failed to unite after 3 months like her magnet was used for the next 3 months and he still had nonunion proven by CT scan. Patient continued pain over the lateral aspect of the left ankle. Because of the nonunion with discomfort we discussed takedown of the nonunion and then application of local bone graft followed by fixation with a either one third tubular plate or an anatomic lateral locking plate. Risks and benefits of surgery discussed at length, all questions encouraged and answered.  Description of procedure: The patient was identified by arm band receive preoperative IV antibiotics in the holding area at, day surgery Center. He then received a right popliteal block, was taken to the operating room where the appropriate anesthetic monitors were attached and general LMA anesthesia was induced. A tourniquet was applied high to the Left calf and the right lower from a prepped and draped in the usual sterile fashion from the toes to the tourniquet. A time out procedure was performed. The limb was then wrapped with an Esmarch bandage and the tourniquet inflated to 300 mm of mercury. We began the operation by making a longitudinal incision starting at the tip of lateral malleolus and going proximally for 7 cm. Small bleeders in the subcutaneous tissue identified and cauterized and using  tenotomy scissors we dissected down to the fracture site and reflected the periosteum anteriorly and posteriorly exposing the nonunion site which was about 2 mm wide going from anterior distal to proximal posterior. Using very small Aundria RudRogers we then cleared out the nonunion site and noted very little motion at the nonunion site possibly there was some medial bridging. We then set about taking a localized bone graft from the hypertrophic portion of the nonunion and packed it in the nonunion site itself. There was more than enough local grafting to accomplish this. We then selected a 5 hole one third tubular plate bent the tip and applied it over the distal fibula contoured to the bone. We placed 3 bicortical screws proximally and one cancellus and 1 unicortical screw distally. C-arm images were then obtained confirming good position of the plate and the bone graft. The tourniquet was let down small bleeders identified and cauterized, we then closed in layers with 3-0 Vicryl suture subcutaneously and subcuticular. A dressing of 0 from 4 x 4 dressing sponges web roll Ace wrap and a Cam Walker boot was then applied. The patient was then awakened extubated and taken to the recovery without difficulty. He will be weightbearing as tolerated with a postop shoe.

## 2016-10-18 NOTE — Interval H&P Note (Signed)
History and Physical Interval Note:  10/18/2016 12:01 PM  Evan Pearson  has presented today for surgery, with the diagnosis of LEFT ANKLE LATERAL MALLEOUS NON UNION  The various methods of treatment have been discussed with the patient and family. After consideration of risks, benefits and other options for treatment, the patient has consented to  Procedure(s): OPEN REDUCTION INTERNAL FIXATION (ORIF) ANKLE: LATERAL ANKLE NON UNION (Left) as a surgical intervention .  The patient's history has been reviewed, patient examined, no change in status, stable for surgery.  I have reviewed the patient's chart and labs.  Questions were answered to the patient's satisfaction.     Nestor LewandowskyOWAN,Kahron Kauth J

## 2016-10-19 ENCOUNTER — Encounter (HOSPITAL_BASED_OUTPATIENT_CLINIC_OR_DEPARTMENT_OTHER): Payer: Self-pay | Admitting: Orthopedic Surgery

## 2016-10-25 DIAGNOSIS — M25572 Pain in left ankle and joints of left foot: Secondary | ICD-10-CM | POA: Diagnosis not present

## 2016-12-07 DIAGNOSIS — M25572 Pain in left ankle and joints of left foot: Secondary | ICD-10-CM | POA: Diagnosis not present

## 2017-01-23 DIAGNOSIS — M25572 Pain in left ankle and joints of left foot: Secondary | ICD-10-CM | POA: Diagnosis not present

## 2017-03-07 DIAGNOSIS — Z23 Encounter for immunization: Secondary | ICD-10-CM | POA: Diagnosis not present

## 2017-03-27 DIAGNOSIS — Z0000 Encounter for general adult medical examination without abnormal findings: Secondary | ICD-10-CM | POA: Diagnosis not present

## 2017-03-27 DIAGNOSIS — Z125 Encounter for screening for malignant neoplasm of prostate: Secondary | ICD-10-CM | POA: Diagnosis not present

## 2017-03-27 DIAGNOSIS — E781 Pure hyperglyceridemia: Secondary | ICD-10-CM | POA: Diagnosis not present

## 2017-05-03 DIAGNOSIS — Z1211 Encounter for screening for malignant neoplasm of colon: Secondary | ICD-10-CM | POA: Diagnosis not present

## 2017-05-03 DIAGNOSIS — K635 Polyp of colon: Secondary | ICD-10-CM | POA: Diagnosis not present

## 2017-05-03 DIAGNOSIS — D126 Benign neoplasm of colon, unspecified: Secondary | ICD-10-CM | POA: Diagnosis not present

## 2017-05-03 DIAGNOSIS — K644 Residual hemorrhoidal skin tags: Secondary | ICD-10-CM | POA: Diagnosis not present

## 2017-05-09 DIAGNOSIS — K635 Polyp of colon: Secondary | ICD-10-CM | POA: Diagnosis not present

## 2017-05-09 DIAGNOSIS — D126 Benign neoplasm of colon, unspecified: Secondary | ICD-10-CM | POA: Diagnosis not present

## 2017-05-09 DIAGNOSIS — Z1211 Encounter for screening for malignant neoplasm of colon: Secondary | ICD-10-CM | POA: Diagnosis not present

## 2017-11-20 DIAGNOSIS — M79672 Pain in left foot: Secondary | ICD-10-CM | POA: Diagnosis not present

## 2017-11-20 DIAGNOSIS — M25572 Pain in left ankle and joints of left foot: Secondary | ICD-10-CM | POA: Diagnosis not present

## 2017-11-20 DIAGNOSIS — M79675 Pain in left toe(s): Secondary | ICD-10-CM | POA: Diagnosis not present

## 2018-01-10 DIAGNOSIS — F40243 Fear of flying: Secondary | ICD-10-CM | POA: Diagnosis not present

## 2018-01-10 DIAGNOSIS — Z23 Encounter for immunization: Secondary | ICD-10-CM | POA: Diagnosis not present

## 2018-03-05 DIAGNOSIS — H5213 Myopia, bilateral: Secondary | ICD-10-CM | POA: Diagnosis not present

## 2018-04-05 DIAGNOSIS — Z23 Encounter for immunization: Secondary | ICD-10-CM | POA: Diagnosis not present

## 2018-04-05 DIAGNOSIS — E781 Pure hyperglyceridemia: Secondary | ICD-10-CM | POA: Diagnosis not present

## 2018-04-05 DIAGNOSIS — Z0001 Encounter for general adult medical examination with abnormal findings: Secondary | ICD-10-CM | POA: Diagnosis not present

## 2018-04-05 DIAGNOSIS — Z125 Encounter for screening for malignant neoplasm of prostate: Secondary | ICD-10-CM | POA: Diagnosis not present

## 2018-04-05 DIAGNOSIS — F1010 Alcohol abuse, uncomplicated: Secondary | ICD-10-CM | POA: Diagnosis not present

## 2018-05-08 DIAGNOSIS — M25561 Pain in right knee: Secondary | ICD-10-CM | POA: Diagnosis not present

## 2018-07-16 DIAGNOSIS — Z23 Encounter for immunization: Secondary | ICD-10-CM | POA: Diagnosis not present

## 2018-08-08 ENCOUNTER — Ambulatory Visit
Admission: RE | Admit: 2018-08-08 | Discharge: 2018-08-08 | Disposition: A | Discharge status: Home / Self Care | Payer: BLUE CROSS/BLUE SHIELD | Source: Ambulatory Visit | Attending: Gastroenterology | Admitting: Gastroenterology

## 2018-08-08 ENCOUNTER — Other Ambulatory Visit: Payer: Self-pay | Admitting: Gastroenterology

## 2018-08-08 DIAGNOSIS — Z0389 Encounter for observation for other suspected diseases and conditions ruled out: Secondary | ICD-10-CM | POA: Diagnosis not present

## 2018-08-08 DIAGNOSIS — Z86010 Personal history of colonic polyps: Secondary | ICD-10-CM | POA: Diagnosis not present

## 2018-08-08 DIAGNOSIS — D123 Benign neoplasm of transverse colon: Secondary | ICD-10-CM | POA: Diagnosis not present

## 2018-08-08 DIAGNOSIS — T17908A Unspecified foreign body in respiratory tract, part unspecified causing other injury, initial encounter: Secondary | ICD-10-CM

## 2018-08-08 DIAGNOSIS — D122 Benign neoplasm of ascending colon: Secondary | ICD-10-CM | POA: Diagnosis not present

## 2018-08-13 DIAGNOSIS — D122 Benign neoplasm of ascending colon: Secondary | ICD-10-CM | POA: Diagnosis not present

## 2018-08-13 DIAGNOSIS — D123 Benign neoplasm of transverse colon: Secondary | ICD-10-CM | POA: Diagnosis not present

## 2018-08-18 IMAGING — CT CT ANKLE*L* W/O CM
1 series · 12 of 14 positions shown, 15 images · non-contrast
Comparison: None.

CLINICAL DATA: History of a left ankle fracture 7 months ago.
Initial encounter.

EXAM:
CT OF THE LEFT ANKLE WITHOUT CONTRAST
TECHNIQUE: Multidetector CT imaging of the left ankle was performed according
to the standard protocol. Multiplanar CT image reconstructions were
also generated.

[Series 4: soft tissue lower extremity · axial · 0.32mm/px · z∈[-1033,-879]mm · 12 of 92 slices shown, 15 images]
[im 8/92  soft-tissue]
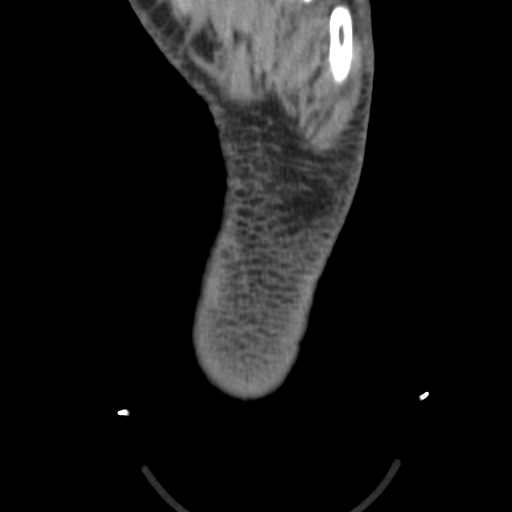
[im 8/92  bone]
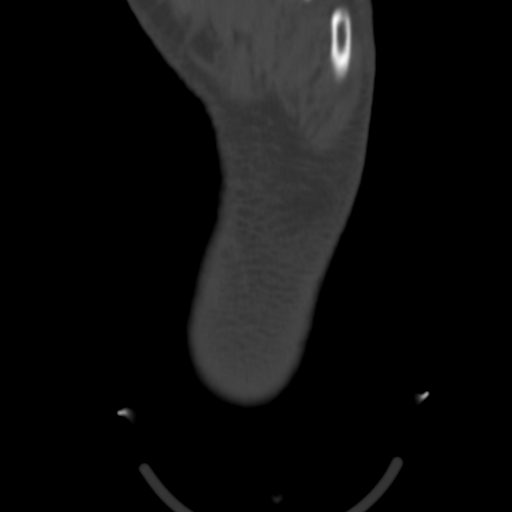
[im 15/92  bone]
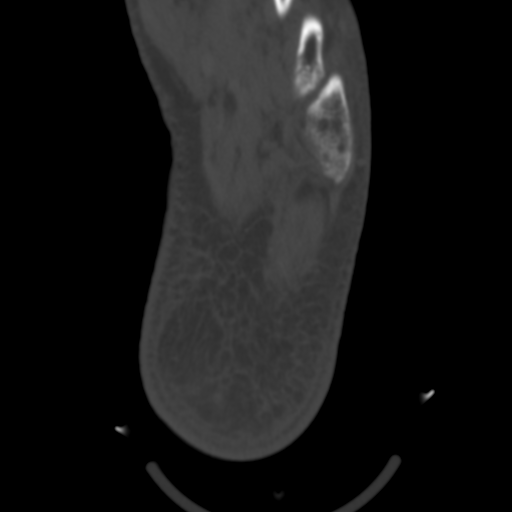
[im 22/92  bone]
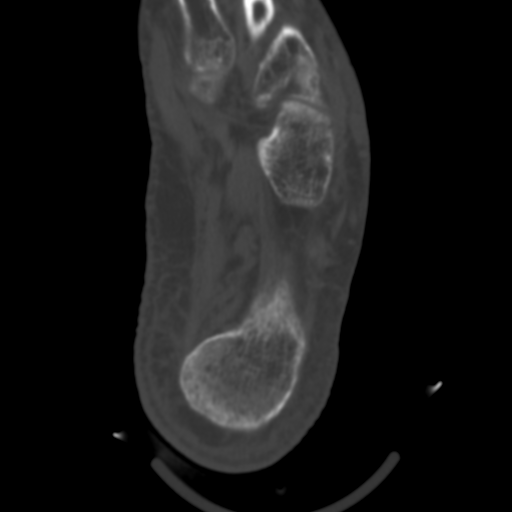
[im 29/92  bone]
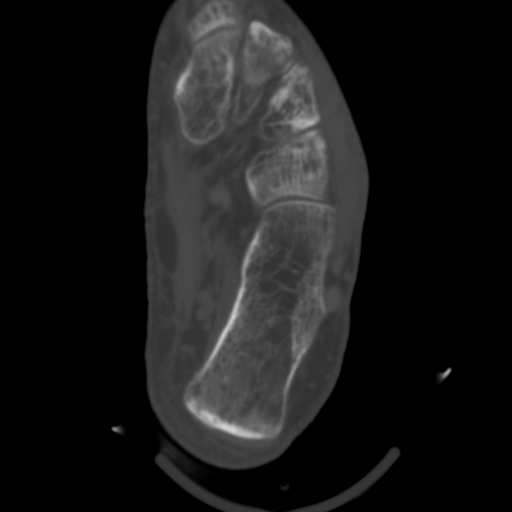
[im 36/92  soft-tissue]
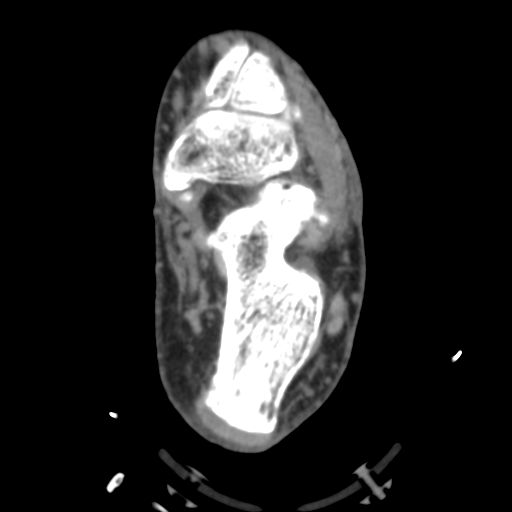
[im 36/92  bone]
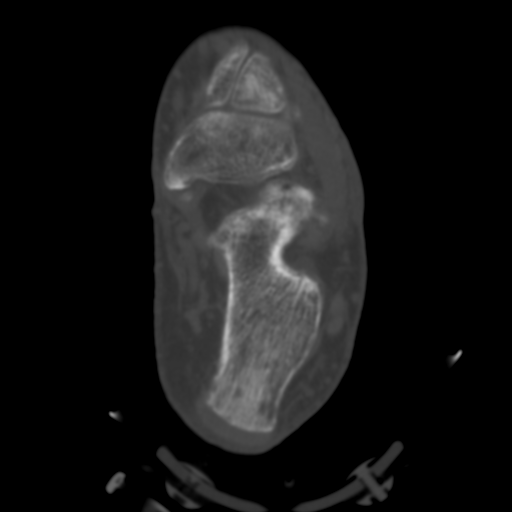
[im 43/92  bone]
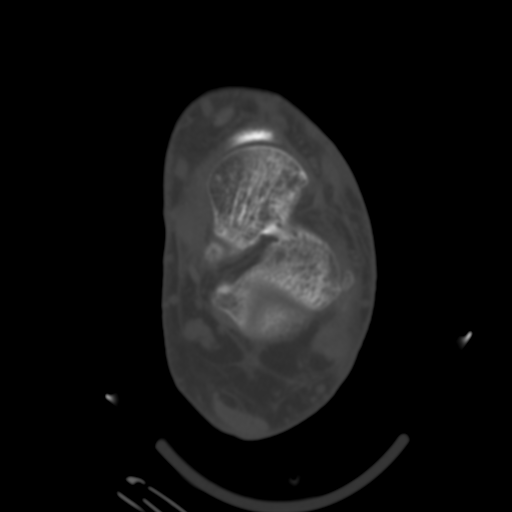
[im 50/92  bone]
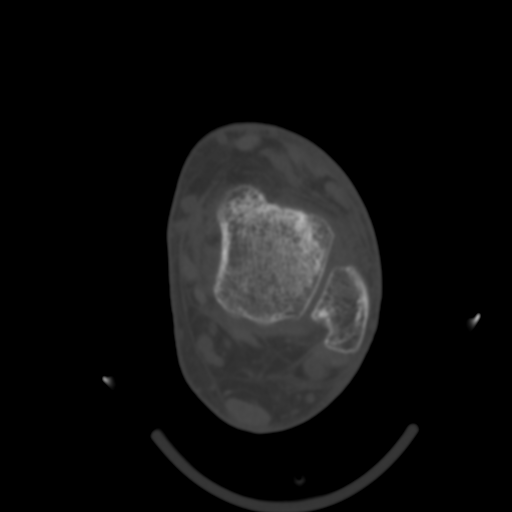
[im 57/92  bone]
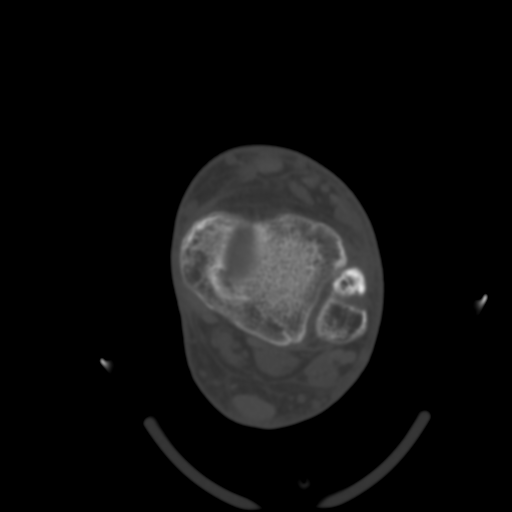
[im 64/92  soft-tissue]
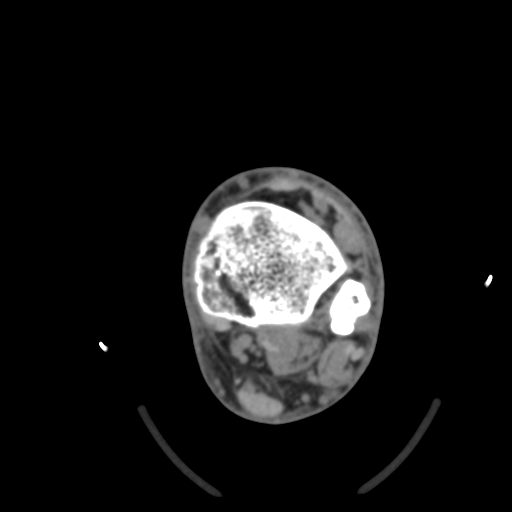
[im 64/92  bone]
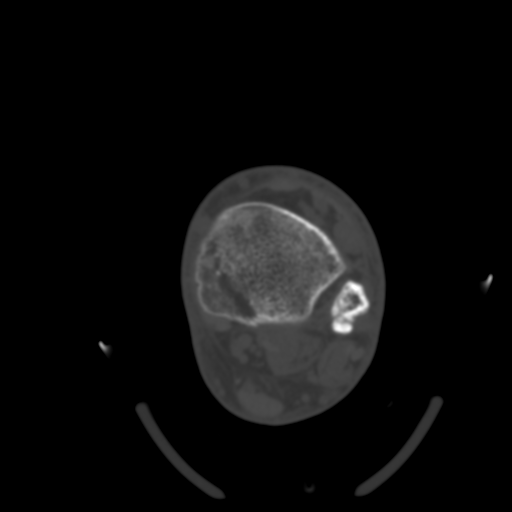
[im 71/92  bone]
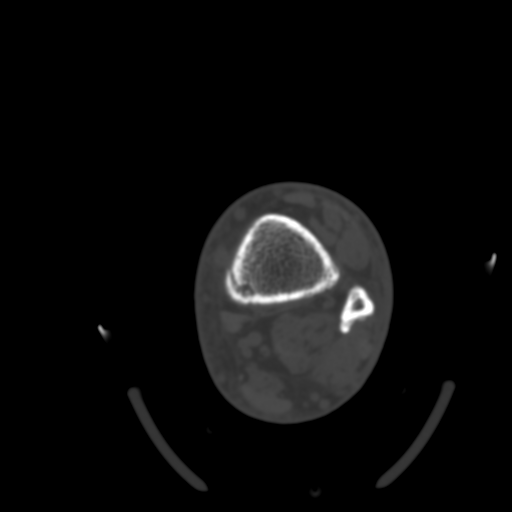
[im 78/92  bone]
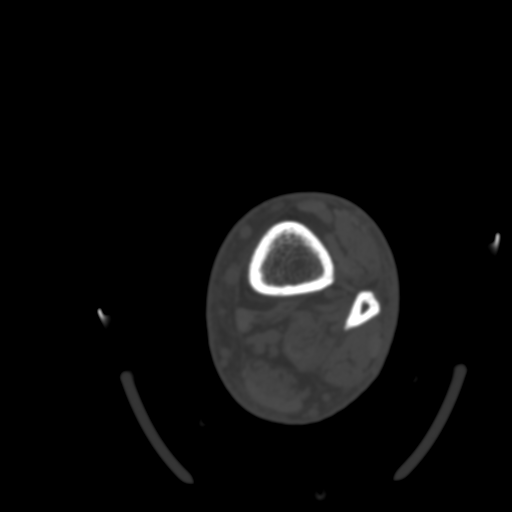
[im 85/92  bone]
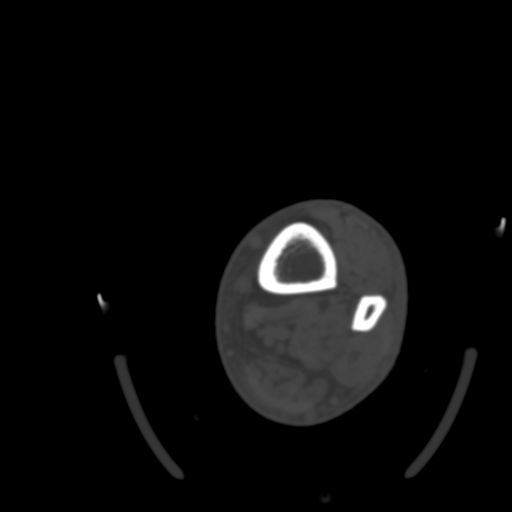

[12 of 14 positions shown; findings below may reference images not displayed]

FINDINGS: Bones/Joint/Cartilage

The patient has an oblique fracture of the distal fibula. The
fracture fragments are mildly distracted and the fracture line
extends from the posterior cortex approximately 6.5 cm above the tip
of the lateral malleolus in an anterior and inferior orientation the
exiting the the distal fibula 2 cm above the tip of the lateral
malleolus. The fracture is largely a nonunion with corticated
margins about the fracture lines. Only small foci of bridging bone
are seen at the superior and inferior margins of the fracture.

The patient also has a nondisplaced fracture of the medial malleolus
extending into the posterior malleolus. This fracture appears
largely healed with partial visualization of fracture lines in the
medullary space.

No other fracture is identified. Bones are osteopenic. No notable
arthropathy. Small accessory ossicle of the navicular is identified.
There is also a tiny well corticated bone fragment off the lateral
process of the talus which may be due to remote trauma.

Ligaments

Suboptimally assessed by CT scan.  Appear intact.

Muscles and Tendons

Intact unremarkable.

Soft tissues

Appear normal.
IMPRESSION: Oblique and slightly distracted fracture of the distal fibula is
largely nonunion with only small areas of bridging bone seen at the
superior most and inferior most margins of the fracture.

Nondisplaced medial to posterior malleolar fracture is largely
healed. Only a small segment of fracture line remains visible within
the medullary space.

## 2019-01-03 DIAGNOSIS — Z20828 Contact with and (suspected) exposure to other viral communicable diseases: Secondary | ICD-10-CM | POA: Diagnosis not present

## 2019-01-03 DIAGNOSIS — Z03818 Encounter for observation for suspected exposure to other biological agents ruled out: Secondary | ICD-10-CM | POA: Diagnosis not present

## 2019-01-03 DIAGNOSIS — Z7189 Other specified counseling: Secondary | ICD-10-CM | POA: Diagnosis not present

## 2019-02-24 DIAGNOSIS — Z23 Encounter for immunization: Secondary | ICD-10-CM | POA: Diagnosis not present

## 2019-03-04 DIAGNOSIS — Z20828 Contact with and (suspected) exposure to other viral communicable diseases: Secondary | ICD-10-CM | POA: Diagnosis not present

## 2019-04-21 DIAGNOSIS — Z125 Encounter for screening for malignant neoplasm of prostate: Secondary | ICD-10-CM | POA: Diagnosis not present

## 2019-04-21 DIAGNOSIS — E781 Pure hyperglyceridemia: Secondary | ICD-10-CM | POA: Diagnosis not present

## 2019-04-22 DIAGNOSIS — Z0000 Encounter for general adult medical examination without abnormal findings: Secondary | ICD-10-CM | POA: Diagnosis not present

## 2019-07-09 DIAGNOSIS — Z7189 Other specified counseling: Secondary | ICD-10-CM | POA: Diagnosis not present

## 2019-07-09 DIAGNOSIS — Z20828 Contact with and (suspected) exposure to other viral communicable diseases: Secondary | ICD-10-CM | POA: Diagnosis not present

## 2019-07-24 ENCOUNTER — Ambulatory Visit: Payer: Self-pay | Attending: Internal Medicine

## 2019-07-24 DIAGNOSIS — Z23 Encounter for immunization: Secondary | ICD-10-CM | POA: Insufficient documentation

## 2019-07-24 NOTE — Progress Notes (Signed)
   Covid-19 Vaccination Clinic  Name:  Evan Pearson    MRN: [id number] DOB: 15-Dec-1965  07/24/2019  [name] was observed post Covid-19 immunization for 15 minutes without incidence. He was provided with Vaccine Information Sheet and instruction to access the V-Safe system.   [name] was instructed to call 911 with any severe reactions post vaccine: Marland Kitchen Difficulty breathing  . Swelling of your face and throat  . A fast heartbeat  . A bad rash all over your body  . Dizziness and weakness    Immunizations Administered    Name Date Dose VIS Date Route   Pfizer COVID-19 Vaccine 07/24/2019 11:42 AM 0.3 mL 05/09/2019 Intramuscular   Manufacturer: [employer], [employer]   Lot: [id number]   NDC: [address]-2

## 2019-07-29 ENCOUNTER — Ambulatory Visit: Payer: Self-pay

## 2019-07-29 NOTE — Telephone Encounter (Signed)
Pt previously scheduled on day 20 after 1st Pfizer vaccination. Changed appt to Saturday 08/16/19. Pt given new vaccination appt and verbalized understanding. Reason for Disposition . General information question, no triage required and triager able to answer question  Answer Assessment - Initial Assessment Questions 1. MAIN CONCERN OR SYMPTOM:  "What is your main concern right now?" "What question do you have?" "What's the main symptom you're worried about?" (e.g., fever, pain, redness, swelling)     Need to change 2nd vaccine appt.  2. VACCINE: "What vaccination did you receive?" "Is this your first or second shot?" (e.g., none; Moderna, [employer], other)  Pfizer  Answer Assessment - Initial Assessment Questions 1. REASON FOR CALL or QUESTION: "What is your reason for calling today?" or "How can I best help you?" or "What question do you have that I can help answer?"     Need t reschedule vaccine appt that was scheduled on Day 20.  Protocols used: INFORMATION ONLY CALL - NO TRIAGE-A-AH, CORONAVIRUS (COVID-19) VACCINE QUESTIONS AND REACTIONS-A-AH

## 2019-08-13 ENCOUNTER — Ambulatory Visit: Payer: Self-pay

## 2019-08-16 ENCOUNTER — Ambulatory Visit: Payer: Self-pay | Attending: Internal Medicine

## 2019-08-16 DIAGNOSIS — Z23 Encounter for immunization: Secondary | ICD-10-CM

## 2019-08-16 NOTE — Progress Notes (Signed)
   Covid-19 Vaccination Clinic  Name:  Evan Pearson    MRN: [id number] DOB: Nov 20, 1965  08/16/2019  Evan Pearson was observed post Covid-19 immunization for 15 minutes without incident. He was provided with Vaccine Information Sheet and instruction to access the V-Safe system.   Evan Pearson was instructed to call 911 with any severe reactions post vaccine: Marland Kitchen Difficulty breathing  . Swelling of face and throat  . A fast heartbeat  . A bad rash all over body  . Dizziness and weakness   Immunizations Administered    Name Date Dose VIS Date Route   Pfizer COVID-19 Vaccine 08/16/2019 10:07 AM 0.3 mL 05/09/2019 Intramuscular   Manufacturer: [employer], [employer]   Lot: [id number]   NDC: [address]-6

## 2019-09-02 DIAGNOSIS — Z03818 Encounter for observation for suspected exposure to other biological agents ruled out: Secondary | ICD-10-CM | POA: Diagnosis not present

## 2019-09-23 DIAGNOSIS — Z03818 Encounter for observation for suspected exposure to other biological agents ruled out: Secondary | ICD-10-CM | POA: Diagnosis not present

## 2019-09-25 DIAGNOSIS — K1379 Other lesions of oral mucosa: Secondary | ICD-10-CM | POA: Diagnosis not present

## 2019-10-24 DIAGNOSIS — Z1159 Encounter for screening for other viral diseases: Secondary | ICD-10-CM | POA: Diagnosis not present

## 2019-10-29 DIAGNOSIS — Z86010 Personal history of colonic polyps: Secondary | ICD-10-CM | POA: Diagnosis not present

## 2019-10-29 DIAGNOSIS — D123 Benign neoplasm of transverse colon: Secondary | ICD-10-CM | POA: Diagnosis not present

## 2019-10-29 DIAGNOSIS — K635 Polyp of colon: Secondary | ICD-10-CM | POA: Diagnosis not present

## 2019-10-29 DIAGNOSIS — K621 Rectal polyp: Secondary | ICD-10-CM | POA: Diagnosis not present

## 2019-11-17 DIAGNOSIS — J343 Hypertrophy of nasal turbinates: Secondary | ICD-10-CM | POA: Diagnosis not present

## 2019-11-17 DIAGNOSIS — L729 Follicular cyst of the skin and subcutaneous tissue, unspecified: Secondary | ICD-10-CM | POA: Diagnosis not present

## 2019-12-04 DIAGNOSIS — L720 Epidermal cyst: Secondary | ICD-10-CM | POA: Diagnosis not present

## 2020-01-09 DIAGNOSIS — Z03818 Encounter for observation for suspected exposure to other biological agents ruled out: Secondary | ICD-10-CM | POA: Diagnosis not present

## 2020-01-11 DIAGNOSIS — Z03818 Encounter for observation for suspected exposure to other biological agents ruled out: Secondary | ICD-10-CM | POA: Diagnosis not present

## 2020-02-10 DIAGNOSIS — Z03818 Encounter for observation for suspected exposure to other biological agents ruled out: Secondary | ICD-10-CM | POA: Diagnosis not present

## 2020-03-01 DIAGNOSIS — Z03818 Encounter for observation for suspected exposure to other biological agents ruled out: Secondary | ICD-10-CM | POA: Diagnosis not present

## 2020-03-09 DIAGNOSIS — M79671 Pain in right foot: Secondary | ICD-10-CM | POA: Diagnosis not present

## 2020-03-26 DIAGNOSIS — Z03818 Encounter for observation for suspected exposure to other biological agents ruled out: Secondary | ICD-10-CM | POA: Diagnosis not present

## 2020-03-31 DIAGNOSIS — Z23 Encounter for immunization: Secondary | ICD-10-CM | POA: Diagnosis not present

## 2020-04-19 DIAGNOSIS — Z125 Encounter for screening for malignant neoplasm of prostate: Secondary | ICD-10-CM | POA: Diagnosis not present

## 2020-04-19 DIAGNOSIS — Z0000 Encounter for general adult medical examination without abnormal findings: Secondary | ICD-10-CM | POA: Diagnosis not present

## 2020-04-19 DIAGNOSIS — E781 Pure hyperglyceridemia: Secondary | ICD-10-CM | POA: Diagnosis not present

## 2020-04-26 DIAGNOSIS — Z0001 Encounter for general adult medical examination with abnormal findings: Secondary | ICD-10-CM | POA: Diagnosis not present

## 2020-04-28 DIAGNOSIS — Z03818 Encounter for observation for suspected exposure to other biological agents ruled out: Secondary | ICD-10-CM | POA: Diagnosis not present

## 2020-05-31 DIAGNOSIS — Z03818 Encounter for observation for suspected exposure to other biological agents ruled out: Secondary | ICD-10-CM | POA: Diagnosis not present

## 2020-06-07 DIAGNOSIS — Z03818 Encounter for observation for suspected exposure to other biological agents ruled out: Secondary | ICD-10-CM | POA: Diagnosis not present

## 2020-06-23 IMAGING — DX CHEST - 2 VIEW
2 series · 2 of 2 positions shown · non-contrast
Comparison: 04/07/2011

CLINICAL DATA: Possible aspiration

EXAM:
CHEST - 2 VIEW

[dg chest 2 view (1 of 2)]
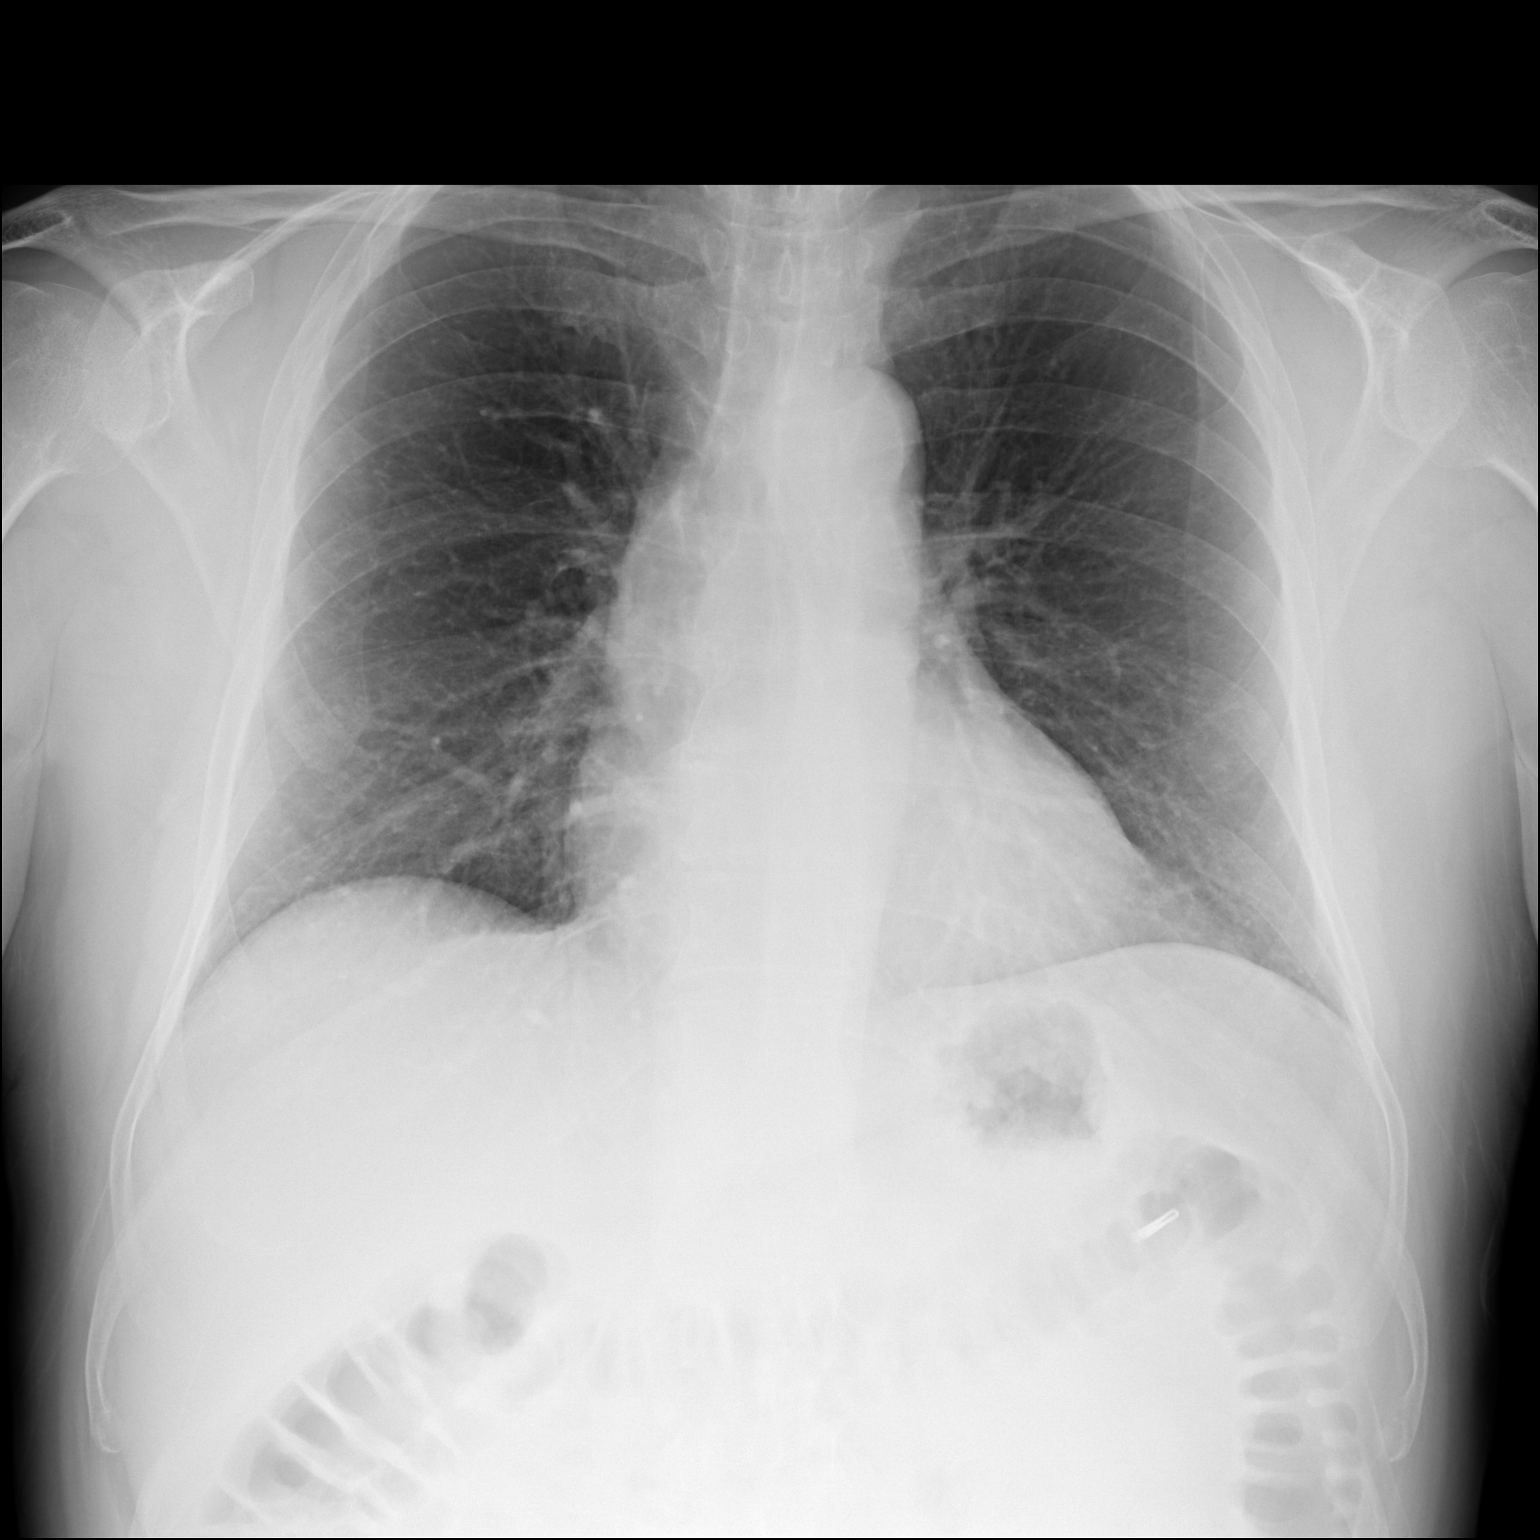

[dg chest 2 view (2 of 2)]
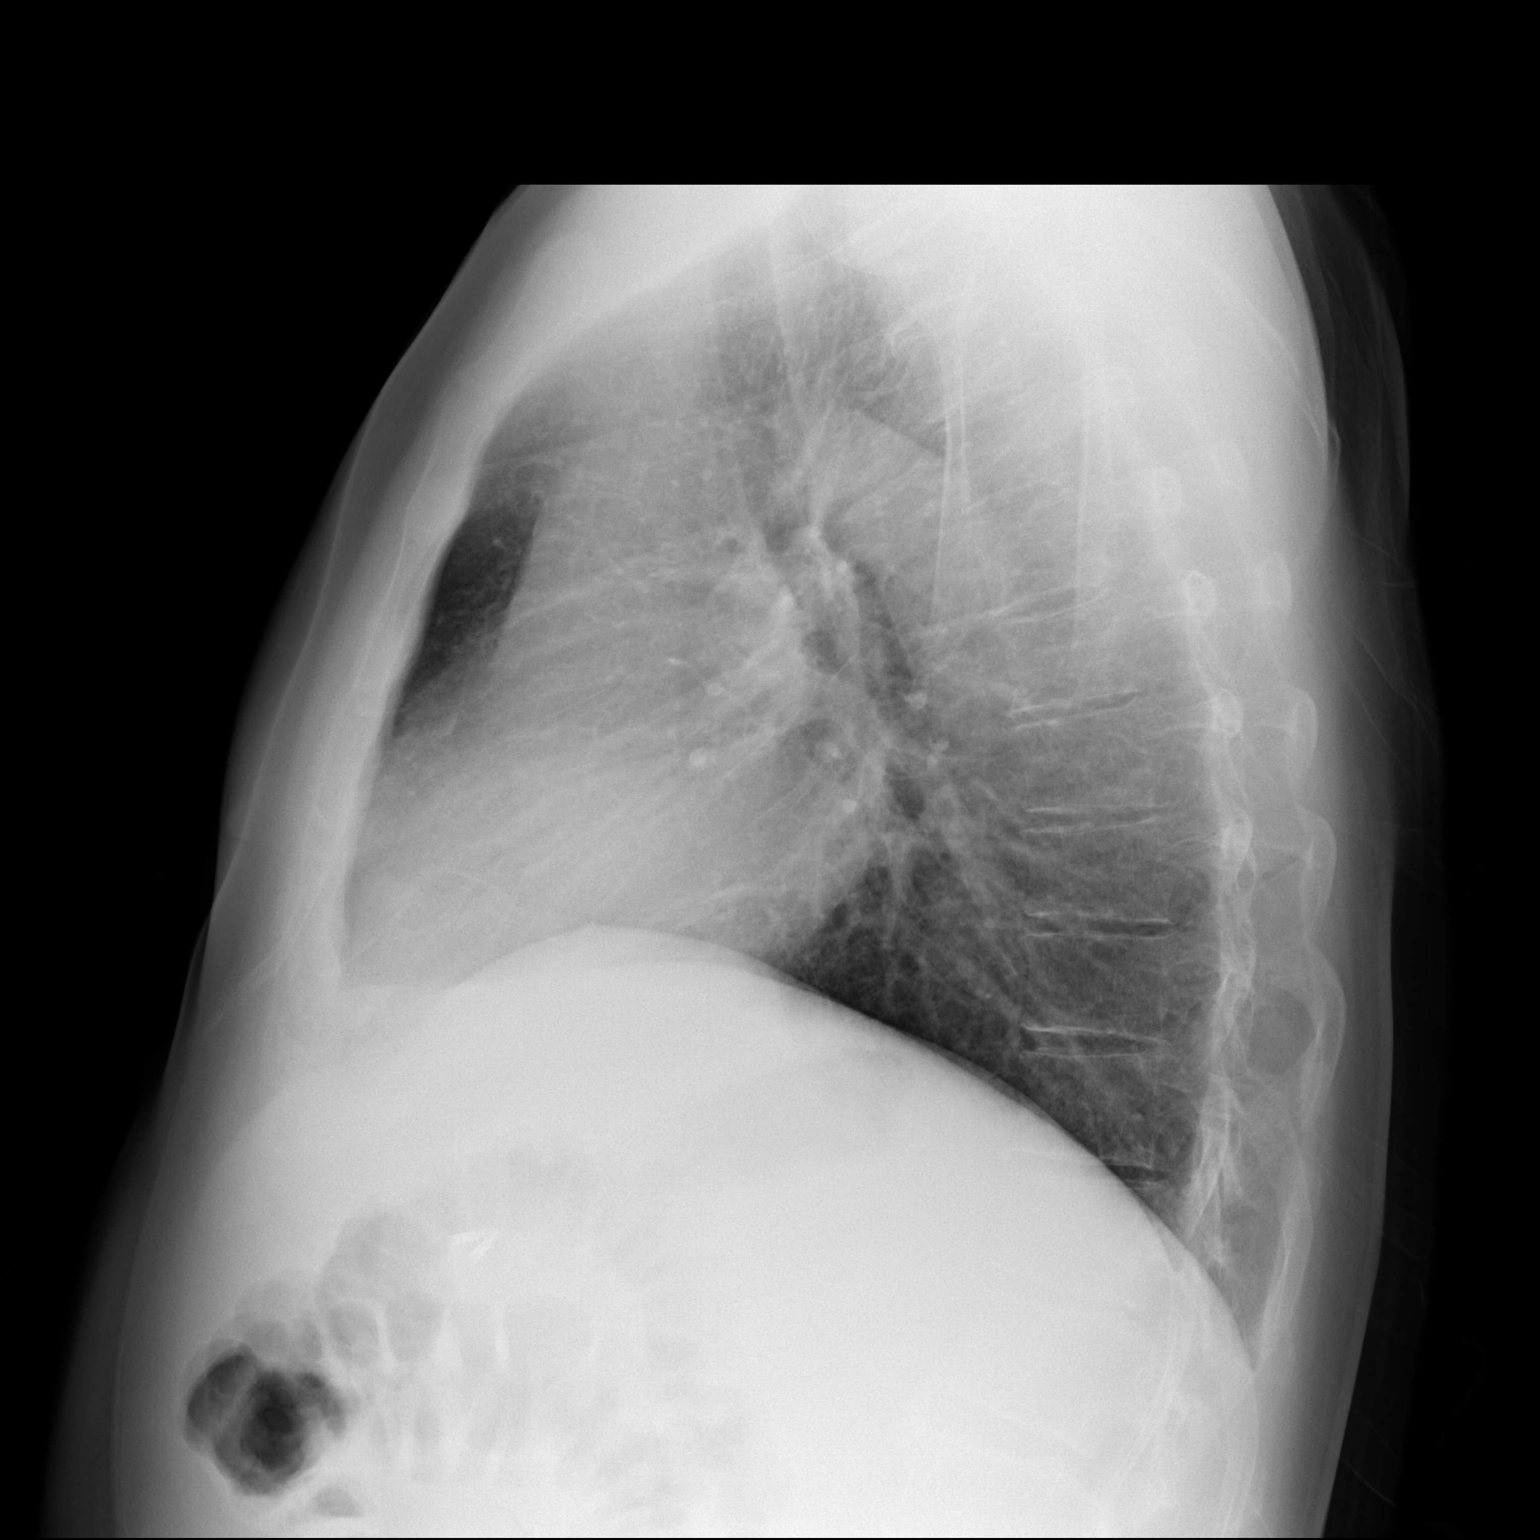

[2 of 2 positions shown; findings below may reference images not displayed]

FINDINGS: The heart size and mediastinal contours are within normal limits.
Both lungs are clear. The visualized skeletal structures are
unremarkable.
IMPRESSION: No active cardiopulmonary disease.

## 2020-07-06 DIAGNOSIS — Z20822 Contact with and (suspected) exposure to covid-19: Secondary | ICD-10-CM | POA: Diagnosis not present

## 2020-07-06 DIAGNOSIS — Z03818 Encounter for observation for suspected exposure to other biological agents ruled out: Secondary | ICD-10-CM | POA: Diagnosis not present

## 2020-08-09 DIAGNOSIS — Z03818 Encounter for observation for suspected exposure to other biological agents ruled out: Secondary | ICD-10-CM | POA: Diagnosis not present

## 2020-08-09 DIAGNOSIS — Z20822 Contact with and (suspected) exposure to covid-19: Secondary | ICD-10-CM | POA: Diagnosis not present

## 2020-08-17 DIAGNOSIS — Z03818 Encounter for observation for suspected exposure to other biological agents ruled out: Secondary | ICD-10-CM | POA: Diagnosis not present

## 2020-08-17 DIAGNOSIS — Z20822 Contact with and (suspected) exposure to covid-19: Secondary | ICD-10-CM | POA: Diagnosis not present

## 2020-08-31 DIAGNOSIS — Z03818 Encounter for observation for suspected exposure to other biological agents ruled out: Secondary | ICD-10-CM | POA: Diagnosis not present

## 2020-09-06 DIAGNOSIS — Z03818 Encounter for observation for suspected exposure to other biological agents ruled out: Secondary | ICD-10-CM | POA: Diagnosis not present

## 2020-09-14 DIAGNOSIS — Z03818 Encounter for observation for suspected exposure to other biological agents ruled out: Secondary | ICD-10-CM | POA: Diagnosis not present

## 2020-09-21 DIAGNOSIS — Z03818 Encounter for observation for suspected exposure to other biological agents ruled out: Secondary | ICD-10-CM | POA: Diagnosis not present

## 2020-09-22 DIAGNOSIS — Z23 Encounter for immunization: Secondary | ICD-10-CM | POA: Diagnosis not present

## 2021-03-16 DIAGNOSIS — Z23 Encounter for immunization: Secondary | ICD-10-CM | POA: Diagnosis not present

## 2021-04-28 DIAGNOSIS — E781 Pure hyperglyceridemia: Secondary | ICD-10-CM | POA: Diagnosis not present

## 2021-04-28 DIAGNOSIS — D7589 Other specified diseases of blood and blood-forming organs: Secondary | ICD-10-CM | POA: Diagnosis not present

## 2021-05-05 DIAGNOSIS — Z125 Encounter for screening for malignant neoplasm of prostate: Secondary | ICD-10-CM | POA: Diagnosis not present

## 2021-05-05 DIAGNOSIS — E781 Pure hyperglyceridemia: Secondary | ICD-10-CM | POA: Diagnosis not present

## 2021-05-05 DIAGNOSIS — R030 Elevated blood-pressure reading, without diagnosis of hypertension: Secondary | ICD-10-CM | POA: Diagnosis not present

## 2021-05-05 DIAGNOSIS — F1010 Alcohol abuse, uncomplicated: Secondary | ICD-10-CM | POA: Diagnosis not present

## 2021-05-05 DIAGNOSIS — L570 Actinic keratosis: Secondary | ICD-10-CM | POA: Diagnosis not present

## 2021-05-05 DIAGNOSIS — Z23 Encounter for immunization: Secondary | ICD-10-CM | POA: Diagnosis not present

## 2021-05-05 DIAGNOSIS — Z0001 Encounter for general adult medical examination with abnormal findings: Secondary | ICD-10-CM | POA: Diagnosis not present

## 2021-06-06 DIAGNOSIS — R030 Elevated blood-pressure reading, without diagnosis of hypertension: Secondary | ICD-10-CM | POA: Diagnosis not present

## 2021-06-06 DIAGNOSIS — G479 Sleep disorder, unspecified: Secondary | ICD-10-CM | POA: Diagnosis not present

## 2021-08-05 DIAGNOSIS — Z23 Encounter for immunization: Secondary | ICD-10-CM | POA: Diagnosis not present

## 2021-08-16 DIAGNOSIS — G4719 Other hypersomnia: Secondary | ICD-10-CM | POA: Diagnosis not present

## 2021-08-16 DIAGNOSIS — R030 Elevated blood-pressure reading, without diagnosis of hypertension: Secondary | ICD-10-CM | POA: Diagnosis not present

## 2021-08-16 DIAGNOSIS — F5104 Psychophysiologic insomnia: Secondary | ICD-10-CM | POA: Diagnosis not present

## 2021-12-26 DIAGNOSIS — R1032 Left lower quadrant pain: Secondary | ICD-10-CM | POA: Diagnosis not present

## 2021-12-26 DIAGNOSIS — F1010 Alcohol abuse, uncomplicated: Secondary | ICD-10-CM | POA: Diagnosis not present

## 2021-12-26 DIAGNOSIS — L570 Actinic keratosis: Secondary | ICD-10-CM | POA: Diagnosis not present

## 2021-12-26 DIAGNOSIS — R030 Elevated blood-pressure reading, without diagnosis of hypertension: Secondary | ICD-10-CM | POA: Diagnosis not present

## 2022-01-10 DIAGNOSIS — R030 Elevated blood-pressure reading, without diagnosis of hypertension: Secondary | ICD-10-CM | POA: Diagnosis not present

## 2022-01-10 DIAGNOSIS — G4733 Obstructive sleep apnea (adult) (pediatric): Secondary | ICD-10-CM | POA: Diagnosis not present

## 2022-01-18 DIAGNOSIS — G4733 Obstructive sleep apnea (adult) (pediatric): Secondary | ICD-10-CM | POA: Diagnosis not present

## 2022-02-18 DIAGNOSIS — G4733 Obstructive sleep apnea (adult) (pediatric): Secondary | ICD-10-CM | POA: Diagnosis not present

## 2022-03-08 DIAGNOSIS — Z23 Encounter for immunization: Secondary | ICD-10-CM | POA: Diagnosis not present

## 2022-03-20 DIAGNOSIS — G4733 Obstructive sleep apnea (adult) (pediatric): Secondary | ICD-10-CM | POA: Diagnosis not present

## 2022-04-13 DIAGNOSIS — G4733 Obstructive sleep apnea (adult) (pediatric): Secondary | ICD-10-CM | POA: Diagnosis not present

## 2022-04-13 DIAGNOSIS — F5104 Psychophysiologic insomnia: Secondary | ICD-10-CM | POA: Diagnosis not present

## 2022-04-13 DIAGNOSIS — R030 Elevated blood-pressure reading, without diagnosis of hypertension: Secondary | ICD-10-CM | POA: Diagnosis not present

## 2022-04-20 DIAGNOSIS — G4733 Obstructive sleep apnea (adult) (pediatric): Secondary | ICD-10-CM | POA: Diagnosis not present

## 2022-04-25 DIAGNOSIS — G4733 Obstructive sleep apnea (adult) (pediatric): Secondary | ICD-10-CM | POA: Diagnosis not present

## 2022-05-09 DIAGNOSIS — E781 Pure hyperglyceridemia: Secondary | ICD-10-CM | POA: Diagnosis not present

## 2022-05-09 DIAGNOSIS — Z125 Encounter for screening for malignant neoplasm of prostate: Secondary | ICD-10-CM | POA: Diagnosis not present

## 2022-05-09 DIAGNOSIS — D7589 Other specified diseases of blood and blood-forming organs: Secondary | ICD-10-CM | POA: Diagnosis not present

## 2022-05-12 DIAGNOSIS — Z0000 Encounter for general adult medical examination without abnormal findings: Secondary | ICD-10-CM | POA: Diagnosis not present

## 2023-05-31 DIAGNOSIS — D7589 Other specified diseases of blood and blood-forming organs: Secondary | ICD-10-CM | POA: Diagnosis not present

## 2023-05-31 DIAGNOSIS — E781 Pure hyperglyceridemia: Secondary | ICD-10-CM | POA: Diagnosis not present

## 2023-05-31 DIAGNOSIS — Z125 Encounter for screening for malignant neoplasm of prostate: Secondary | ICD-10-CM | POA: Diagnosis not present

## 2023-06-13 ENCOUNTER — Other Ambulatory Visit (HOSPITAL_COMMUNITY): Payer: Self-pay | Admitting: Family Medicine

## 2023-06-13 DIAGNOSIS — Z0000 Encounter for general adult medical examination without abnormal findings: Secondary | ICD-10-CM | POA: Diagnosis not present

## 2023-06-13 DIAGNOSIS — F1010 Alcohol abuse, uncomplicated: Secondary | ICD-10-CM | POA: Diagnosis not present

## 2023-06-13 DIAGNOSIS — G4733 Obstructive sleep apnea (adult) (pediatric): Secondary | ICD-10-CM | POA: Diagnosis not present

## 2023-06-13 DIAGNOSIS — E781 Pure hyperglyceridemia: Secondary | ICD-10-CM

## 2023-06-13 DIAGNOSIS — L570 Actinic keratosis: Secondary | ICD-10-CM | POA: Diagnosis not present

## 2023-06-13 DIAGNOSIS — R030 Elevated blood-pressure reading, without diagnosis of hypertension: Secondary | ICD-10-CM | POA: Diagnosis not present

## 2023-06-21 ENCOUNTER — Other Ambulatory Visit (HOSPITAL_COMMUNITY): Payer: Self-pay

## 2023-06-26 ENCOUNTER — Ambulatory Visit (HOSPITAL_COMMUNITY)
Admission: RE | Admit: 2023-06-26 | Discharge: 2023-06-26 | Disposition: A | Discharge status: Home / Self Care | Payer: Self-pay | Source: Ambulatory Visit | Attending: Family Medicine | Admitting: Family Medicine

## 2023-06-26 DIAGNOSIS — E781 Pure hyperglyceridemia: Secondary | ICD-10-CM | POA: Insufficient documentation

## 2023-07-16 DIAGNOSIS — D7589 Other specified diseases of blood and blood-forming organs: Secondary | ICD-10-CM | POA: Diagnosis not present

## 2023-07-25 DIAGNOSIS — Z713 Dietary counseling and surveillance: Secondary | ICD-10-CM | POA: Diagnosis not present

## 2023-07-25 DIAGNOSIS — E781 Pure hyperglyceridemia: Secondary | ICD-10-CM | POA: Diagnosis not present

## 2023-07-25 DIAGNOSIS — F1010 Alcohol abuse, uncomplicated: Secondary | ICD-10-CM | POA: Diagnosis not present

## 2023-07-25 DIAGNOSIS — I2510 Atherosclerotic heart disease of native coronary artery without angina pectoris: Secondary | ICD-10-CM | POA: Diagnosis not present

## 2024-02-20 DIAGNOSIS — Z23 Encounter for immunization: Secondary | ICD-10-CM | POA: Diagnosis not present

## 2024-06-26 ENCOUNTER — Other Ambulatory Visit (HOSPITAL_COMMUNITY): Payer: Self-pay | Admitting: Family Medicine

## 2024-06-26 DIAGNOSIS — I7121 Aneurysm of the ascending aorta, without rupture: Secondary | ICD-10-CM

## 2024-07-07 ENCOUNTER — Ambulatory Visit (HOSPITAL_COMMUNITY): Payer: Self-pay
